# Patient Record
Sex: Female | Born: 1965 | Race: Black or African American | Hispanic: No | Marital: Married | Smoking: Never smoker
Health system: Southern US, Community
[De-identification: ages and names within clinical notes are randomized; demographics above are authoritative.]

## PROBLEM LIST (undated history)

## (undated) DIAGNOSIS — I10 Essential (primary) hypertension: Secondary | ICD-10-CM

## (undated) DIAGNOSIS — E119 Type 2 diabetes mellitus without complications: Secondary | ICD-10-CM

## (undated) DIAGNOSIS — M869 Osteomyelitis, unspecified: Secondary | ICD-10-CM

## (undated) DIAGNOSIS — D649 Anemia, unspecified: Secondary | ICD-10-CM

## (undated) DIAGNOSIS — H409 Unspecified glaucoma: Secondary | ICD-10-CM

## (undated) HISTORY — PX: CATARACT EXTRACTION, BILATERAL: SHX1313

## (undated) HISTORY — DX: Unspecified glaucoma: H40.9

## (undated) HISTORY — DX: Essential (primary) hypertension: I10

## (undated) HISTORY — DX: Type 2 diabetes mellitus without complications: E11.9

---

## 1999-12-12 ENCOUNTER — Emergency Department (HOSPITAL_COMMUNITY): Admission: EM | Admit: 1999-12-12 | Discharge: 1999-12-12 | Payer: Self-pay | Admitting: Emergency Medicine

## 1999-12-12 ENCOUNTER — Encounter: Payer: Self-pay | Admitting: *Deleted

## 1999-12-13 ENCOUNTER — Encounter: Payer: Self-pay | Admitting: Surgery

## 1999-12-13 ENCOUNTER — Emergency Department (HOSPITAL_COMMUNITY): Admission: EM | Admit: 1999-12-13 | Discharge: 1999-12-13 | Payer: Self-pay | Admitting: Emergency Medicine

## 1999-12-24 ENCOUNTER — Encounter: Admission: RE | Admit: 1999-12-24 | Discharge: 2000-03-23 | Payer: Self-pay | Admitting: Emergency Medicine

## 2001-01-14 ENCOUNTER — Encounter: Admission: RE | Admit: 2001-01-14 | Discharge: 2001-04-14 | Payer: Self-pay | Admitting: Emergency Medicine

## 2007-04-28 HISTORY — PX: OTHER SURGICAL HISTORY: SHX169

## 2007-09-01 ENCOUNTER — Encounter: Admission: RE | Admit: 2007-09-01 | Discharge: 2007-09-01 | Payer: Self-pay | Admitting: Family Medicine

## 2007-11-21 ENCOUNTER — Encounter: Admission: RE | Admit: 2007-11-21 | Discharge: 2008-01-17 | Payer: Self-pay | Admitting: Family Medicine

## 2007-11-24 ENCOUNTER — Encounter: Admission: RE | Admit: 2007-11-24 | Discharge: 2007-11-24 | Payer: Self-pay | Admitting: Surgery

## 2008-01-04 ENCOUNTER — Ambulatory Visit (HOSPITAL_BASED_OUTPATIENT_CLINIC_OR_DEPARTMENT_OTHER): Admission: RE | Admit: 2008-01-04 | Discharge: 2008-01-04 | Payer: Self-pay | Admitting: Surgery

## 2008-01-04 ENCOUNTER — Encounter (INDEPENDENT_AMBULATORY_CARE_PROVIDER_SITE_OTHER): Payer: Self-pay | Admitting: Surgery

## 2008-10-04 ENCOUNTER — Encounter: Admission: RE | Admit: 2008-10-04 | Discharge: 2008-10-04 | Payer: Self-pay | Admitting: Family Medicine

## 2008-11-28 ENCOUNTER — Emergency Department (HOSPITAL_COMMUNITY): Admission: EM | Admit: 2008-11-28 | Discharge: 2008-11-28 | Payer: Self-pay | Admitting: Emergency Medicine

## 2010-04-11 ENCOUNTER — Encounter
Admission: RE | Admit: 2010-04-11 | Discharge: 2010-04-11 | Payer: Self-pay | Source: Home / Self Care | Attending: Family Medicine | Admitting: Family Medicine

## 2010-07-28 ENCOUNTER — Emergency Department (HOSPITAL_COMMUNITY)
Admission: EM | Admit: 2010-07-28 | Discharge: 2010-07-29 | Disposition: A | Discharge status: Home / Self Care | Payer: Managed Care, Other (non HMO) | Attending: Emergency Medicine | Admitting: Emergency Medicine

## 2010-07-28 DIAGNOSIS — R112 Nausea with vomiting, unspecified: Secondary | ICD-10-CM | POA: Insufficient documentation

## 2010-07-28 DIAGNOSIS — I10 Essential (primary) hypertension: Secondary | ICD-10-CM | POA: Insufficient documentation

## 2010-07-28 DIAGNOSIS — E119 Type 2 diabetes mellitus without complications: Secondary | ICD-10-CM | POA: Insufficient documentation

## 2010-07-28 LAB — POCT CARDIAC MARKERS
CKMB, poc: 6.8 ng/mL (ref 1.0–8.0)
Myoglobin, poc: 206 ng/mL (ref 12–200)
Troponin i, poc: <0.05 ng/mL (ref 0.00–0.09)

## 2010-07-28 LAB — URINALYSIS, ROUTINE W REFLEX MICROSCOPIC
Bilirubin Urine: NEGATIVE
Glucose, UA: >1000 mg/dL — AB
Protein, ur: NEGATIVE mg/dL
Specific Gravity, Urine: 1.023 (ref 1.005–1.030)
Urobilinogen, UA: 1 mg/dL (ref 0.0–1.0)

## 2010-07-28 LAB — DIFFERENTIAL
Basophils Relative: 1 % (ref 0–1)
Eosinophils Absolute: 0.1 10*3/uL (ref 0.0–0.7)
Lymphocytes Relative: 19 % (ref 12–46)
Neutrophils Relative %: 73 % (ref 43–77)

## 2010-07-28 LAB — BASIC METABOLIC PANEL
CO2: 26 mEq/L (ref 19–32)
Chloride: 106 mEq/L (ref 96–112)
GFR calc Af Amer: >60 mL/min (ref >60)
Potassium: 3.9 mEq/L (ref 3.5–5.1)
Sodium: 138 mEq/L (ref 135–145)

## 2010-07-28 LAB — CBC
Hemoglobin: 10.9 g/dL — ABNORMAL LOW (ref 12.0–15.0)
MCH: 21.2 pg — ABNORMAL LOW (ref 26.0–34.0)
RBC: 5.13 MIL/uL — ABNORMAL HIGH (ref 3.87–5.11)
WBC: 7.1 10*3/uL (ref 4.0–10.5)

## 2010-07-28 LAB — URINE MICROSCOPIC-ADD ON

## 2010-07-29 LAB — GLUCOSE, CAPILLARY: Glucose-Capillary: 274 mg/dL — ABNORMAL HIGH (ref 70–99)

## 2010-08-02 LAB — DIFFERENTIAL
Basophils Relative: 1 % (ref 0–1)
Eosinophils Relative: 1 % (ref 0–5)
Monocytes Absolute: 0.4 10*3/uL (ref 0.1–1.0)
Neutrophils Relative %: 71 % (ref 43–77)

## 2010-08-02 LAB — URINALYSIS, ROUTINE W REFLEX MICROSCOPIC
Glucose, UA: NEGATIVE mg/dL
Protein, ur: NEGATIVE mg/dL
Urobilinogen, UA: 0.2 mg/dL (ref 0.0–1.0)

## 2010-08-02 LAB — BASIC METABOLIC PANEL
BUN: 11 mg/dL (ref 6–23)
CO2: 29 mEq/L (ref 19–32)
Calcium: 8.9 mg/dL (ref 8.4–10.5)
Glucose, Bld: 141 mg/dL — ABNORMAL HIGH (ref 70–99)

## 2010-08-02 LAB — GLUCOSE, CAPILLARY: Glucose-Capillary: 136 mg/dL — ABNORMAL HIGH (ref 70–99)

## 2010-08-02 LAB — CBC
HCT: 38.5 % (ref 36.0–46.0)
Hemoglobin: 12.8 g/dL (ref 12.0–15.0)
MCV: 83.1 fL (ref 78.0–100.0)
RBC: 4.63 MIL/uL (ref 3.87–5.11)
WBC: 5.6 10*3/uL (ref 4.0–10.5)

## 2010-08-02 LAB — POCT PREGNANCY, URINE: Preg Test, Ur: NEGATIVE

## 2010-09-09 NOTE — Op Note (Signed)
NAMEBRANDE, UNCAPHER                  ACCOUNT NO.:  [id number]   MEDICAL RECORD NO.:  [id number]          PATIENT TYPE:  AMB   LOCATION:  DSC                          FACILITY:  [facility]   PHYSICIAN:  Thornton Park. Daphine Deutscher, MD  DATE OF BIRTH:  1965-09-17   DATE OF PROCEDURE:  01/04/2008  DATE OF DISCHARGE:                               OPERATIVE REPORT   PREOPERATIVE INDICATIONS:  This is a 45 year old African American lady  with recurrent yellow oily discharge from the center portion of the left  nipple.  Unsuccessful ductography.   PROCEDURE:  Left breast biopsy.   SURGEON:  Thornton Park. Daphine Deutscher, MD   ANESTHESIA:  General.   DESCRIPTION OF PROCEDURE:  Mulki Roesler was taken to room #3 at [facility] on January 04, 2008, and given general anesthesia.  The left  breast was examined and previously had been marked, and after she was  asleep, I could generate the central discharge that had been previously  noted.  An inferior periareolar incision was made after I cannulated the  central duct with a small breast probe.  Using 2.5 power loupe  magnification, I did a retroareolar dissection encountering the point of  my probe and encountering yellowish fluid coming from a fairly complex  large retroareolar cyst.  I excised this in multiple pieces and it  tended to run out toward the 2 o'clock position in the left breast.  I  felt like I had excised it in toto, and massaging the breast in looking  down into this cavity, no other drainage was noted and no palpable  masses or any way worrisome were seen.  Some of this fluid was gaiety  yellow-gold and oily and some of it was more of a darkish look like more  chronic fibrocystic fluid.  The area was irrigated and then closed with  4-0 Vicryls and with Benzoin and Steri-Strips.  The patient tolerated  the procedure well.  She will be given Tylox to take for pain and will  be followed up in the office in about 3 weeks.  Permanent sections and  pathology or pending.      Thornton Park Daphine Deutscher, MD  Electronically Signed     MBM/MEDQ  D:  01/04/2008  T:  01/05/2008  Job:  [id number]   cc:   Annia Friendly. Loleta Chance, MD  Trude Mcburney. Kearney Hard, M.D.

## 2011-01-28 LAB — GLUCOSE, CAPILLARY: Glucose-Capillary: 99

## 2011-01-28 LAB — BASIC METABOLIC PANEL
BUN: 8
Calcium: 8.8
Creatinine, Ser: 1.12
GFR calc non Af Amer: 53 — ABNORMAL LOW
Potassium: 4.2

## 2016-04-27 HISTORY — PX: OTHER SURGICAL HISTORY: SHX169

## 2017-04-27 HISTORY — PX: EYE SURGERY: SHX253

## 2019-10-25 ENCOUNTER — Other Ambulatory Visit (HOSPITAL_COMMUNITY): Payer: Self-pay | Admitting: Physician Assistant

## 2019-10-25 DIAGNOSIS — Z1231 Encounter for screening mammogram for malignant neoplasm of breast: Secondary | ICD-10-CM

## 2019-11-15 ENCOUNTER — Ambulatory Visit (HOSPITAL_COMMUNITY)
Admission: RE | Admit: 2019-11-15 | Discharge: 2019-11-15 | Disposition: A | Discharge status: Home / Self Care | Payer: Managed Care, Other (non HMO) | Source: Ambulatory Visit | Attending: Physician Assistant | Admitting: Physician Assistant

## 2019-11-15 ENCOUNTER — Other Ambulatory Visit: Payer: Self-pay

## 2019-11-15 DIAGNOSIS — Z1231 Encounter for screening mammogram for malignant neoplasm of breast: Secondary | ICD-10-CM | POA: Diagnosis present

## 2019-12-18 ENCOUNTER — Other Ambulatory Visit (HOSPITAL_COMMUNITY)
Admission: RE | Admit: 2019-12-18 | Discharge: 2019-12-18 | Disposition: A | Discharge status: Home / Self Care | Payer: Managed Care, Other (non HMO) | Source: Ambulatory Visit | Attending: Adult Health | Admitting: Adult Health

## 2019-12-18 ENCOUNTER — Ambulatory Visit (INDEPENDENT_AMBULATORY_CARE_PROVIDER_SITE_OTHER): Payer: Managed Care, Other (non HMO) | Admitting: Adult Health

## 2019-12-18 ENCOUNTER — Encounter: Payer: Self-pay | Admitting: Adult Health

## 2019-12-18 VITALS — BP 137/70 | HR 57 | Ht 68.0 in | Wt 226.8 lb

## 2019-12-18 DIAGNOSIS — Z01419 Encounter for gynecological examination (general) (routine) without abnormal findings: Secondary | ICD-10-CM

## 2019-12-18 DIAGNOSIS — Z1211 Encounter for screening for malignant neoplasm of colon: Secondary | ICD-10-CM

## 2019-12-18 LAB — HEMOCCULT GUIAC POC 1CARD (OFFICE): Fecal Occult Blood, POC: NEGATIVE

## 2019-12-18 NOTE — Progress Notes (Signed)
Patient ID: Jenny Newton, female   DOB: 12/19/1965, 54 y.o.   MRN: [id number] History of Present Illness: Jenny Newton is a 54 year old black female,married, PM in for a well woman gyn exam and pap. She is a new pt PCP is Dwyane Luo PA at [facility].    Current Medications, Allergies, Past Medical History, Past Surgical History, Family History and Social History were reviewed in [employer] record.     Review of Systems: Patient denies any headaches, hearing loss, fatigue, blurred vision, shortness of breath, chest pain, abdominal pain, problems with bowel movements, urination, or intercourse. No joint pain or mood swings. No vaginal bleeding since stopping period.    Physical Exam:BP 137/70 (BP Location: Right Arm, Patient Position: Sitting, Cuff Size: Normal)   Pulse (!) 57   Ht 5\' 8"  (1.727 m)   Wt 226 lb 12.8 oz (102.9 kg)   BMI 34.48 kg/m  General:  Well developed, well nourished, no acute distress Skin:  Warm and dry,has skin tags on neck Neck:  Midline trachea, normal thyroid, good ROM, no lymphadenopathy Lungs; Clear to auscultation bilaterally Breast:  No dominant palpable mass, retraction, or nipple discharge Cardiovascular: Regular rate and rhythm Abdomen:  Soft, non tender, no hepatosplenomegaly Pelvic:  External genitalia is normal in appearance, no lesions.  The vagina is normal in appearance. Urethra has no lesions or masses. The cervix is smooth, pap with high risk HPV genotyping performed.  Uterus is felt to be normal size, shape, and contour.  No adnexal masses or tenderness noted.Bladder is non tender, no masses felt. Rectal: Good sphincter tone, no polyps, or hemorrhoids felt.  Hemoccult negative. Extremities/musculoskeletal:  No swelling or varicosities noted, no clubbing or cyanosis Psych:  No mood changes, alert and cooperative,seems happy AA is 0 Fall risk is low PHQ 9 score is 0. Examination chaperoned by LPN    [facility] - 12/18/19  0930      Pregnancy Intention Screening   Does the patient want to become pregnant in the next year? N/A    Does the patient's partner want to become pregnant in the next year? N/A    Would the patient like to discuss contraceptive options today? N/A      Contraception Wrap Up   Contraception Counseling Provided No          Impression and plan: 1. Encounter for gynecological examination with Papanicolaou smear of cervix Pap sent  Physical in 1 year  Pap in 3 if normal Mammogram yearly, was negative in July Labs with PCP Pt got J&J vaccine October 01 2019 she says  Colonoscopy per GI   2. Encounter for screening fecal occult blood testing

## 2019-12-19 LAB — CYTOLOGY - PAP
Adequacy: ABSENT
Comment: NEGATIVE
Diagnosis: NEGATIVE
High risk HPV: NEGATIVE

## 2019-12-20 ENCOUNTER — Telehealth: Payer: Self-pay | Admitting: *Deleted

## 2019-12-20 NOTE — Telephone Encounter (Signed)
Telephoned patient at home number and left message to return call.  

## 2020-06-14 ENCOUNTER — Other Ambulatory Visit (HOSPITAL_COMMUNITY): Payer: Self-pay | Admitting: Orthopedic Surgery

## 2020-06-14 ENCOUNTER — Encounter (HOSPITAL_BASED_OUTPATIENT_CLINIC_OR_DEPARTMENT_OTHER): Payer: Self-pay | Admitting: Orthopedic Surgery

## 2020-06-14 ENCOUNTER — Other Ambulatory Visit: Payer: Self-pay

## 2020-06-17 ENCOUNTER — Encounter (HOSPITAL_BASED_OUTPATIENT_CLINIC_OR_DEPARTMENT_OTHER)
Admission: RE | Admit: 2020-06-17 | Discharge: 2020-06-17 | Disposition: A | Discharge status: Home / Self Care | Payer: Managed Care, Other (non HMO) | Source: Ambulatory Visit | Attending: Orthopedic Surgery | Admitting: Orthopedic Surgery

## 2020-06-17 ENCOUNTER — Other Ambulatory Visit (HOSPITAL_COMMUNITY)
Admission: RE | Admit: 2020-06-17 | Discharge: 2020-06-17 | Disposition: A | Discharge status: Home / Self Care | Payer: Managed Care, Other (non HMO) | Source: Ambulatory Visit | Attending: Orthopedic Surgery | Admitting: Orthopedic Surgery

## 2020-06-17 DIAGNOSIS — Z01818 Encounter for other preprocedural examination: Secondary | ICD-10-CM | POA: Insufficient documentation

## 2020-06-17 DIAGNOSIS — Z01812 Encounter for preprocedural laboratory examination: Secondary | ICD-10-CM | POA: Insufficient documentation

## 2020-06-17 DIAGNOSIS — E119 Type 2 diabetes mellitus without complications: Secondary | ICD-10-CM | POA: Diagnosis not present

## 2020-06-17 DIAGNOSIS — I10 Essential (primary) hypertension: Secondary | ICD-10-CM | POA: Diagnosis not present

## 2020-06-17 DIAGNOSIS — Z20822 Contact with and (suspected) exposure to covid-19: Secondary | ICD-10-CM | POA: Insufficient documentation

## 2020-06-17 LAB — BASIC METABOLIC PANEL
Anion gap: 8 (ref 5–15)
BUN: 18 mg/dL (ref 6–20)
CO2: 25 mmol/L (ref 22–32)
Calcium: 9.3 mg/dL (ref 8.9–10.3)
Chloride: 102 mmol/L (ref 98–111)
Creatinine, Ser: 1.57 mg/dL — ABNORMAL HIGH (ref 0.44–1.00)
GFR, Estimated: 38 mL/min — ABNORMAL LOW (ref >60)
Glucose, Bld: 260 mg/dL — ABNORMAL HIGH (ref 70–99)
Potassium: 4.7 mmol/L (ref 3.5–5.1)
Sodium: 135 mmol/L (ref 135–145)

## 2020-06-17 NOTE — Progress Notes (Signed)

## 2020-06-18 LAB — SARS CORONAVIRUS 2 (TAT 6-24 HRS): SARS Coronavirus 2: NEGATIVE

## 2020-06-20 ENCOUNTER — Encounter (HOSPITAL_BASED_OUTPATIENT_CLINIC_OR_DEPARTMENT_OTHER): Payer: Self-pay | Admitting: Orthopedic Surgery

## 2020-06-20 ENCOUNTER — Ambulatory Visit (HOSPITAL_BASED_OUTPATIENT_CLINIC_OR_DEPARTMENT_OTHER)
Admission: RE | Admit: 2020-06-20 | Discharge: 2020-06-20 | Disposition: A | Discharge status: Home / Self Care | Payer: Managed Care, Other (non HMO) | Attending: Orthopedic Surgery | Admitting: Orthopedic Surgery

## 2020-06-20 ENCOUNTER — Ambulatory Visit (HOSPITAL_BASED_OUTPATIENT_CLINIC_OR_DEPARTMENT_OTHER): Payer: Managed Care, Other (non HMO) | Admitting: Anesthesiology

## 2020-06-20 ENCOUNTER — Encounter (HOSPITAL_BASED_OUTPATIENT_CLINIC_OR_DEPARTMENT_OTHER)
Admission: RE | Disposition: A | Discharge status: Home / Self Care | Payer: Self-pay | Source: Home / Self Care | Attending: Orthopedic Surgery

## 2020-06-20 ENCOUNTER — Other Ambulatory Visit: Payer: Self-pay

## 2020-06-20 DIAGNOSIS — L97528 Non-pressure chronic ulcer of other part of left foot with other specified severity: Secondary | ICD-10-CM | POA: Insufficient documentation

## 2020-06-20 DIAGNOSIS — I96 Gangrene, not elsewhere classified: Secondary | ICD-10-CM | POA: Diagnosis not present

## 2020-06-20 DIAGNOSIS — M869 Osteomyelitis, unspecified: Secondary | ICD-10-CM | POA: Insufficient documentation

## 2020-06-20 DIAGNOSIS — Z79899 Other long term (current) drug therapy: Secondary | ICD-10-CM | POA: Diagnosis not present

## 2020-06-20 DIAGNOSIS — E11621 Type 2 diabetes mellitus with foot ulcer: Secondary | ICD-10-CM | POA: Insufficient documentation

## 2020-06-20 DIAGNOSIS — E1169 Type 2 diabetes mellitus with other specified complication: Secondary | ICD-10-CM | POA: Diagnosis present

## 2020-06-20 DIAGNOSIS — L97524 Non-pressure chronic ulcer of other part of left foot with necrosis of bone: Secondary | ICD-10-CM

## 2020-06-20 DIAGNOSIS — Z7984 Long term (current) use of oral hypoglycemic drugs: Secondary | ICD-10-CM | POA: Diagnosis not present

## 2020-06-20 DIAGNOSIS — M2042 Other hammer toe(s) (acquired), left foot: Secondary | ICD-10-CM | POA: Insufficient documentation

## 2020-06-20 DIAGNOSIS — E1152 Type 2 diabetes mellitus with diabetic peripheral angiopathy with gangrene: Secondary | ICD-10-CM | POA: Insufficient documentation

## 2020-06-20 HISTORY — PX: AMPUTATION TOE: SHX6595

## 2020-06-20 HISTORY — DX: Osteomyelitis, unspecified: M86.9

## 2020-06-20 HISTORY — DX: Anemia, unspecified: D64.9

## 2020-06-20 LAB — GLUCOSE, CAPILLARY
Glucose-Capillary: 142 mg/dL — ABNORMAL HIGH (ref 70–99)
Glucose-Capillary: 127 mg/dL — ABNORMAL HIGH (ref 70–99)

## 2020-06-20 SURGERY — AMPUTATION, TOE
Anesthesia: Monitor Anesthesia Care | Site: Toe | Laterality: Left

## 2020-06-20 MED ORDER — BUPIVACAINE HCL (PF) 0.5 % IJ SOLN
INTRAMUSCULAR | Status: DC | PRN
Start: 2020-06-20 — End: 2020-06-20
  Administered 2020-06-20: 5 mL

## 2020-06-20 MED ORDER — CEFAZOLIN SODIUM-DEXTROSE 2-4 GM/100ML-% IV SOLN
2.0000 g | INTRAVENOUS | Status: AC
  Administered 2020-06-20: 2 g via INTRAVENOUS

## 2020-06-20 MED ORDER — 0.9 % SODIUM CHLORIDE (POUR BTL) OPTIME
TOPICAL | Status: DC | PRN
  Administered 2020-06-20: 200 mL

## 2020-06-20 MED ORDER — VANCOMYCIN HCL 500 MG IV SOLR
INTRAVENOUS | Status: DC | PRN
  Administered 2020-06-20: 500 mg via TOPICAL

## 2020-06-20 MED ORDER — ONDANSETRON HCL 4 MG/2ML IJ SOLN
INTRAMUSCULAR | Status: DC | PRN
  Administered 2020-06-20: 4 mg via INTRAVENOUS

## 2020-06-20 MED ORDER — PROPOFOL 10 MG/ML IV BOLUS
INTRAVENOUS | Status: DC | PRN
  Administered 2020-06-20: 50 mg via INTRAVENOUS

## 2020-06-20 MED ORDER — PROPOFOL 500 MG/50ML IV EMUL
INTRAVENOUS | Status: DC | PRN
  Administered 2020-06-20: 100 ug/kg/min via INTRAVENOUS

## 2020-06-20 MED ORDER — CEFAZOLIN SODIUM-DEXTROSE 2-4 GM/100ML-% IV SOLN
INTRAVENOUS | Status: AC
  Filled 2020-06-20: qty 100

## 2020-06-20 MED ORDER — LACTATED RINGERS IV SOLN: INTRAVENOUS | Status: DC

## 2020-06-20 MED ORDER — LIDOCAINE 2% (20 MG/ML) 5 ML SYRINGE
INTRAMUSCULAR | Status: DC | PRN
  Administered 2020-06-20: 60 mg via INTRAVENOUS

## 2020-06-20 MED ORDER — VANCOMYCIN HCL 500 MG IV SOLR
INTRAVENOUS | Status: AC
  Filled 2020-06-20: qty 500

## 2020-06-20 MED ORDER — SODIUM CHLORIDE 0.9 % IV SOLN: INTRAVENOUS | Status: DC

## 2020-06-20 MED ORDER — BUPIVACAINE HCL (PF) 0.5 % IJ SOLN
INTRAMUSCULAR | Status: AC
  Filled 2020-06-20: qty 30

## 2020-06-20 MED ORDER — LIDOCAINE-EPINEPHRINE 1 %-1:100000 IJ SOLN
INTRAMUSCULAR | Status: DC | PRN
  Administered 2020-06-20: 5 mL

## 2020-06-20 MED ORDER — LIDOCAINE-EPINEPHRINE 1 %-1:100000 IJ SOLN
INTRAMUSCULAR | Status: AC
  Filled 2020-06-20: qty 1

## 2020-06-20 SURGICAL SUPPLY — 58 items
BLADE AVERAGE 25X9 (BLADE) IMPLANT
BLADE OSC/SAG .038X5.5 CUT EDG (BLADE) IMPLANT
BLADE SURG 10 STRL SS (BLADE) IMPLANT
BLADE SURG 15 STRL LF DISP TIS (BLADE) ×1 IMPLANT
BLADE SURG 15 STRL SS (BLADE) ×2
BNDG COHESIVE 4X5 TAN STRL (GAUZE/BANDAGES/DRESSINGS) ×2 IMPLANT
BNDG CONFORM 3 STRL LF (GAUZE/BANDAGES/DRESSINGS) IMPLANT
BNDG ESMARK 4X9 LF (GAUZE/BANDAGES/DRESSINGS) ×2 IMPLANT
CHLORAPREP W/TINT 26 (MISCELLANEOUS) ×2 IMPLANT
COVER BACK TABLE 60X90IN (DRAPES) ×2 IMPLANT
COVER WAND RF STERILE (DRAPES) IMPLANT
CUFF TOURN SGL QUICK 24 (TOURNIQUET CUFF)
CUFF TRNQT CYL 24X4X16.5-23 (TOURNIQUET CUFF) IMPLANT
DECANTER SPIKE VIAL GLASS SM (MISCELLANEOUS) IMPLANT
DRAPE EXTREMITY T 121X128X90 (DISPOSABLE) ×2 IMPLANT
DRAPE SURG 17X23 STRL (DRAPES) ×2 IMPLANT
DRAPE U-SHAPE 47X51 STRL (DRAPES) ×2 IMPLANT
DRSG EMULSION OIL 3X3 NADH (GAUZE/BANDAGES/DRESSINGS) IMPLANT
DRSG MEPITEL 4X7.2 (GAUZE/BANDAGES/DRESSINGS) ×2 IMPLANT
DRSG PAD ABDOMINAL 8X10 ST (GAUZE/BANDAGES/DRESSINGS) IMPLANT
ELECT REM PT RETURN 9FT ADLT (ELECTROSURGICAL) ×2
ELECTRODE REM PT RTRN 9FT ADLT (ELECTROSURGICAL) ×1 IMPLANT
GAUZE SPONGE 4X4 12PLY STRL (GAUZE/BANDAGES/DRESSINGS) ×2 IMPLANT
GLOVE SRG 8 PF TXTR STRL LF DI (GLOVE) ×2 IMPLANT
GLOVE SURG ENC MOIS LTX SZ8 (GLOVE) ×2 IMPLANT
GLOVE SURG LTX SZ8 (GLOVE) ×2 IMPLANT
GLOVE SURG UNDER POLY LF SZ8 (GLOVE) ×4
GOWN STRL REUS W/ TWL LRG LVL3 (GOWN DISPOSABLE) ×1 IMPLANT
GOWN STRL REUS W/ TWL XL LVL3 (GOWN DISPOSABLE) ×2 IMPLANT
GOWN STRL REUS W/TWL LRG LVL3 (GOWN DISPOSABLE) ×2
GOWN STRL REUS W/TWL XL LVL3 (GOWN DISPOSABLE) ×4
NDL SAFETY ECLIPSE 18X1.5 (NEEDLE) IMPLANT
NEEDLE HYPO 18GX1.5 SHARP (NEEDLE)
NEEDLE HYPO 25X1 1.5 SAFETY (NEEDLE) IMPLANT
NS IRRIG 1000ML POUR BTL (IV SOLUTION) ×2 IMPLANT
PACK BASIN DAY SURGERY FS (CUSTOM PROCEDURE TRAY) ×2 IMPLANT
PAD CAST 4YDX4 CTTN HI CHSV (CAST SUPPLIES) ×1 IMPLANT
PADDING CAST COTTON 4X4 STRL (CAST SUPPLIES) ×2
PENCIL SMOKE EVACUATOR (MISCELLANEOUS) ×2 IMPLANT
SANITIZER HAND PURELL 535ML FO (MISCELLANEOUS) ×2 IMPLANT
SHEET MEDIUM DRAPE 40X70 STRL (DRAPES) ×2 IMPLANT
SLEEVE SCD COMPRESS KNEE MED (MISCELLANEOUS) ×2 IMPLANT
SPONGE LAP 18X18 RF (DISPOSABLE) ×2 IMPLANT
STOCKINETTE 6  STRL (DRAPES) ×2
STOCKINETTE 6 STRL (DRAPES) ×1 IMPLANT
SUCTION FRAZIER HANDLE 10FR (MISCELLANEOUS)
SUCTION TUBE FRAZIER 10FR DISP (MISCELLANEOUS) IMPLANT
SUT ETHILON 2 0 FS 18 (SUTURE) IMPLANT
SUT ETHILON 2 0 FSLX (SUTURE) IMPLANT
SUT ETHILON 3 0 PS 1 (SUTURE) IMPLANT
SUT MNCRL AB 3-0 PS2 18 (SUTURE) IMPLANT
SWAB COLLECTION DEVICE MRSA (MISCELLANEOUS) IMPLANT
SWAB CULTURE ESWAB REG 1ML (MISCELLANEOUS) IMPLANT
SYR BULB EAR ULCER 3OZ GRN STR (SYRINGE) ×2 IMPLANT
SYR CONTROL 10ML LL (SYRINGE) IMPLANT
TOWEL GREEN STERILE FF (TOWEL DISPOSABLE) ×2 IMPLANT
TUBE CONNECTING 20X1/4 (TUBING) IMPLANT
UNDERPAD 30X36 HEAVY ABSORB (UNDERPADS AND DIAPERS) ×2 IMPLANT

## 2020-06-20 NOTE — Op Note (Signed)
06/20/2020  2:11 PM  PATIENT:  Jenny Newton  55 y.o. female  PRE-OPERATIVE DIAGNOSIS: Left third toe osteomyelitis and gangrene  POST-OPERATIVE DIAGNOSIS: Same  Procedure(s):  Left 3rd toe amputation through the MTP joint  SURGEON:  Wylene Simmer, MD  ASSISTANT: None  ANESTHESIA:   MAC, local  EBL:  minimal   TOURNIQUET:   Total Tourniquet Time Documented: Calf (Left) - 6 minutes Total: Calf (Left) - 6 minutes  COMPLICATIONS:  None apparent  DISPOSITION:  Extubated, awake and stable to recovery.  INDICATION FOR PROCEDURE: The patient is a 55 year old female with a past medical history significant for diabetes. She has a long history of left hammertoe deformities and developed an ulcer which progressed to cellulitis, osteomyelitis and gangrene. She presents now for left third toe amputation.  The risks and benefits of the alternative treatment options have been discussed in detail.  The patient wishes to proceed with surgery and specifically understands risks of bleeding, infection, nerve damage, blood clots, need for additional surgery, amputation and death.  PROCEDURE IN DETAIL:  After pre operative consent was obtained, and the correct operative site was identified, the patient was brought to the operating room and placed supine on the OR table.  Anesthesia was administered.  Pre-operative antibiotics were administered.  A surgical timeout was taken. The left lower extremity was prepped and draped in standard sterile fashion. The foot was exsanguinated and a 4 inch Esmarch tourniquet wrapped around the ankle. An intermetatarsal block was performed with half percent Marcaine with epinephrine. Once adequate anesthesia was achieved a racquet style incision was marked on the skin. Dissection was carried sharply down through the skin and subcutaneous tissues to the level of the bone of the proximal phalanx. Subperiosteal dissection was then carried to the level of the MTP joint and the toe  was disarticulated and removed. It was passed off the field. The wound was irrigated copiously. All remaining soft tissues appeared generally healthy. The neurovascular bundles were cauterized. The tourniquet was released and hemostasis was achieved. The wound was sprinkled with vancomycin powder. The incision was closed with horizontal mattress sutures of 3-0 nylon. Sterile dressings were applied followed by compression wrap. The patient was awakened from anesthesia and transported to the recovery room in stable condition.  FOLLOW UP PLAN: Weightbearing as tolerated in a flat postop shoe. Follow-up in the office in 2 weeks for a wound check and possible suture removal.

## 2020-06-20 NOTE — Transfer of Care (Signed)
Immediate Anesthesia Transfer of Care Note  Patient: Jenny Newton  Procedure(s) Performed: Left 3rd toe amputation (Left Toe)  Patient Location: PACU  Anesthesia Type:MAC  Level of Consciousness: sedated  Airway & Oxygen Therapy: Patient Spontanous Breathing and Patient connected to face mask oxygen  Post-op Assessment: Report given to RN and Post -op Vital signs reviewed and stable  Post vital signs: Reviewed and stable  Last Vitals:  Vitals Value Taken Time  BP 123/70 06/20/20 1405  Temp    Pulse 56 06/20/20 1406  Resp 23 06/20/20 1406  SpO2 100 % 06/20/20 1406  Vitals shown include unvalidated device data.  Last Pain:  Vitals:   06/20/20 1213  TempSrc: Oral  PainSc: 0-No pain      Patients Stated Pain Goal: 2 (51/70/01 7494)  Complications: No complications documented.

## 2020-06-20 NOTE — Discharge Instructions (Addendum)
John Hewitt, MD [facility]  Please read the following information regarding your care after surgery.  Medications  You only need a prescription for the narcotic pain medicine (ex. oxycodone, Percocet, Norco).  All of the other medicines listed below are available over the counter. ? Aleve 2 pills twice a day for the first 3 days after surgery. X acetominophen (Tylenol) 650 mg every 4-6 hours as you need for minor to moderate pain ? oxycodone as prescribed for severe pain  Narcotic pain medicine (ex. oxycodone, Percocet, Vicodin) will cause constipation.  To prevent this problem, take the following medicines while you are taking any pain medicine. ? docusate sodium (Colace) 100 mg twice a day ? senna (Senokot) 2 tablets twice a day  ? To help prevent blood clots, take a baby aspirin (81 mg) twice a day for two weeks after surgery.  You should also get up every hour while you are awake to move around.    Weight Bearing ? Bear weight when you are able on your operated leg or foot. X Bear weight only on your operated foot in the post-op shoe. ? Do not bear any weight on the operated leg or foot.  Cast / Splint / Dressing X Keep your splint, cast or dressing clean and dry.  Don't put anything (coat hanger, pencil, etc) down inside of it.  If it gets damp, use a hair dryer on the cool setting to dry it.  If it gets soaked, call the office to schedule an appointment for a cast change. ? Remove your dressing 3 days after surgery and cover the incisions with dry dressings.    After your dressing, cast or splint is removed; you may shower, but do not soak or scrub the wound.  Allow the water to run over it, and then gently pat it dry.  Swelling It is normal for you to have swelling where you had surgery.  To reduce swelling and pain, keep your toes above your nose for at least 3 days after surgery.  It may be necessary to keep your foot or leg elevated for several weeks.  If it hurts, it should be  elevated.  Follow Up Call my office at [phone] when you are discharged from the hospital or surgery center to schedule an appointment to be seen two weeks after surgery.  Call my office at [phone] if you develop a fever >101.5 F, nausea, vomiting, bleeding from the surgical site or severe pain.     Post Anesthesia Home Care Instructions  Activity: Get plenty of rest for the remainder of the day. A responsible individual must stay with you for 24 hours following the procedure.  For the next 24 hours, DO NOT: -Drive a car -Operate machinery -Drink alcoholic beverages -Take any medication unless instructed by your physician -Make any legal decisions or sign important papers.  Meals: Start with liquid foods such as gelatin or soup. Progress to regular foods as tolerated. Avoid greasy, spicy, heavy foods. If nausea and/or vomiting occur, drink only clear liquids until the nausea and/or vomiting subsides. Call your physician if vomiting continues.  Special Instructions/Symptoms: Your throat may feel dry or sore from the anesthesia or the breathing tube placed in your throat during surgery. If this causes discomfort, gargle with warm salt water. The discomfort should disappear within 24 hours.  If you had a scopolamine patch placed behind your ear for the management of post- operative nausea and/or vomiting:  1. The medication in the patch is   effective for 72 hours, after which it should be removed.  Wrap patch in a tissue and discard in the trash. Wash hands thoroughly with soap and water. 2. You may remove the patch earlier than 72 hours if you experience unpleasant side effects which may include dry mouth, dizziness or visual disturbances. 3. Avoid touching the patch. Wash your hands with soap and water after contact with the patch.     

## 2020-06-20 NOTE — Anesthesia Preprocedure Evaluation (Signed)
Anesthesia Evaluation  Patient identified by MRN, date of birth, ID band Patient awake    Reviewed: Allergy & Precautions, H&P , NPO status , Patient's Chart, lab work & pertinent test results  Airway Mallampati: II   Neck ROM: full    Dental   Pulmonary neg pulmonary ROS,    breath sounds clear to auscultation       Cardiovascular hypertension,  Rhythm:regular Rate:Normal     Neuro/Psych    GI/Hepatic   Endo/Other  diabetes, Type 2obese  Renal/GU Renal InsufficiencyRenal disease     Musculoskeletal   Abdominal   Peds  Hematology   Anesthesia Other Findings   Reproductive/Obstetrics                             Anesthesia Physical Anesthesia Plan  ASA: II  Anesthesia Plan: MAC   Post-op Pain Management:    Induction: Intravenous  PONV Risk Score and Plan: 2 and Ondansetron, Propofol infusion, Treatment may vary due to age or medical condition and Midazolam  Airway Management Planned: Simple Face Mask  Additional Equipment:   Intra-op Plan:   Post-operative Plan:   Informed Consent: I have reviewed the patients History and Physical, chart, labs and discussed the procedure including the risks, benefits and alternatives for the proposed anesthesia with the patient or authorized representative who has indicated his/her understanding and acceptance.     Dental advisory given  Plan Discussed with: CRNA, Anesthesiologist and Surgeon  Anesthesia Plan Comments:         Anesthesia Quick Evaluation

## 2020-06-20 NOTE — H&P (Signed)
Jenny Newton is an 55 y.o. female.  Chief Complaint: Left third toe infection HPI: The patient is a 55 year old female with a past medical history significant for diabetes.  She has a long history of hammertoe deformities.  She has developed an ulcer at the tip of her left foot third toe which has become wet gangrene involving the entire toe.  She presents now for left third toe amputation.  She is taking oral Bactrim.  Past Medical History:  Diagnosis Date  . Anemia   . Diabetes mellitus without complication (HCC)   . Glaucoma   . Hypertension   . Osteomyelitis (HCC)    left 3rd toe    Past Surgical History:  Procedure Laterality Date  . CATARACT EXTRACTION, BILATERAL    . eue surgery  2018  . EYE SURGERY  2019  . left breast surgery  2009    Family History  Problem Relation Age of Onset  . Hypertension Father   . Diabetes Father    Social History:  reports that she has never smoked. She has never used smokeless tobacco. She reports previous alcohol use. She reports previous drug use.  Allergies: No Known Allergies  Medications Prior to Admission  Medication Sig Dispense Refill  . amLODipine (NORVASC) 10 MG tablet Take 10 mg by mouth daily.    . Ascorbic Acid (VITAMIN C) 1000 MG tablet Take 1,000 mg by mouth daily.    . Brinzolamide-Brimonidine (SIMBRINZA) 1-0.2 % SUSP Apply to eye.    . ferrous sulfate 324 MG TBEC Take 324 mg by mouth.    . glyBURIDE (DIABETA) 5 MG tablet Take 5 mg by mouth 2 (two) times daily.    Marland Kitchen losartan-hydrochlorothiazide (HYZAAR) 50-12.5 MG tablet Take 1 tablet by mouth daily.    . metFORMIN (GLUCOPHAGE) 1000 MG tablet SMARTSIG:1 Tablet(s) By Mouth Morning-Evening    . Netarsudil-Latanoprost (ROCKLATAN) 0.02-0.005 % SOLN Apply to eye.    . sulfamethoxazole-trimethoprim (BACTRIM DS) 800-160 MG tablet Take 1 tablet by mouth 2 (two) times daily.      Results for orders placed or performed during the hospital encounter of 06/20/20 (from the past 48  hour(s))  Glucose, capillary     Status: Abnormal   Collection Time: 06/20/20 12:17 PM  Result Value Ref Range   Glucose-Capillary 142 (H) 70 - 99 mg/dL    Comment: Glucose reference range applies only to samples taken after fasting for at least 8 hours.   No results found.  Review of Systems no recent fever, chills, nausea, vomiting or changes in her appetite  Blood pressure (!) 166/67, pulse 60, temperature 98.2 F (36.8 C), temperature source Oral, resp. rate 18, height 5\' 8"  (1.727 m), weight 102.9 kg, SpO2 100 %. Physical Exam  Well-nourished well-developed woman in no apparent distress.  Alert and oriented x4.  Normal mood and affect.  Gait is normal.  Left foot has rigid second, third and fourth hammertoe deformities.  The third toe is gangrenous.  Diminished sensibility to light touch at the left forefoot dorsally and plantarly.  Active plantar flexion and dorsiflexion strength of the ankle is 5 out of 5.   Assessment/Plan Left third toe gangrene -to the operating room today for left third toe amputation through the MP joint.  The risks and benefits of the alternative treatment options have been discussed in detail.  The patient wishes to proceed with surgery and specifically understands risks of bleeding, infection, nerve damage, blood clots, need for additional surgery, amputation and death.  Toni Arthurs, MD 06/20/2020, 1:08 PM

## 2020-06-21 ENCOUNTER — Encounter (HOSPITAL_BASED_OUTPATIENT_CLINIC_OR_DEPARTMENT_OTHER): Payer: Self-pay | Admitting: Orthopedic Surgery

## 2020-06-21 NOTE — Anesthesia Postprocedure Evaluation (Signed)
Anesthesia Post Note  Patient: Jenny Newton  Procedure(s) Performed: Left 3rd toe amputation (Left Toe)     Patient location during evaluation: PACU Anesthesia Type: MAC Level of consciousness: awake and alert Pain management: pain level controlled Vital Signs Assessment: post-procedure vital signs reviewed and stable Respiratory status: spontaneous breathing, nonlabored ventilation, respiratory function stable and patient connected to nasal cannula oxygen Cardiovascular status: stable and blood pressure returned to baseline Postop Assessment: no apparent nausea or vomiting Anesthetic complications: no   No complications documented.  Last Vitals:  Vitals:   06/20/20 1422 06/20/20 1445  BP: (!) 145/69 (!) 158/64  Pulse: (!) 54 (!) 54  Resp: 15 16  Temp:  36.6 C  SpO2: 100% 100%    Last Pain:  Vitals:   06/20/20 1445  TempSrc:   PainSc: 0-No pain                 Merrillyn Ackerley S

## 2020-12-09 ENCOUNTER — Other Ambulatory Visit (HOSPITAL_COMMUNITY): Payer: Self-pay | Admitting: Orthopedic Surgery

## 2020-12-26 ENCOUNTER — Other Ambulatory Visit: Payer: Self-pay

## 2020-12-26 ENCOUNTER — Encounter (HOSPITAL_BASED_OUTPATIENT_CLINIC_OR_DEPARTMENT_OTHER): Payer: Self-pay | Admitting: Orthopedic Surgery

## 2020-12-31 ENCOUNTER — Encounter (HOSPITAL_BASED_OUTPATIENT_CLINIC_OR_DEPARTMENT_OTHER)
Admission: RE | Admit: 2020-12-31 | Discharge: 2020-12-31 | Disposition: A | Discharge status: Home / Self Care | Payer: Managed Care, Other (non HMO) | Source: Ambulatory Visit | Attending: Orthopedic Surgery | Admitting: Orthopedic Surgery

## 2020-12-31 DIAGNOSIS — Z01812 Encounter for preprocedural laboratory examination: Secondary | ICD-10-CM | POA: Insufficient documentation

## 2020-12-31 DIAGNOSIS — M2041 Other hammer toe(s) (acquired), right foot: Secondary | ICD-10-CM | POA: Diagnosis not present

## 2020-12-31 DIAGNOSIS — Z79899 Other long term (current) drug therapy: Secondary | ICD-10-CM | POA: Diagnosis not present

## 2020-12-31 DIAGNOSIS — E11621 Type 2 diabetes mellitus with foot ulcer: Secondary | ICD-10-CM | POA: Diagnosis not present

## 2020-12-31 DIAGNOSIS — Z89422 Acquired absence of other left toe(s): Secondary | ICD-10-CM | POA: Diagnosis not present

## 2020-12-31 DIAGNOSIS — Z833 Family history of diabetes mellitus: Secondary | ICD-10-CM | POA: Diagnosis not present

## 2020-12-31 DIAGNOSIS — Z7984 Long term (current) use of oral hypoglycemic drugs: Secondary | ICD-10-CM | POA: Diagnosis not present

## 2020-12-31 DIAGNOSIS — L97519 Non-pressure chronic ulcer of other part of right foot with unspecified severity: Secondary | ICD-10-CM | POA: Diagnosis present

## 2020-12-31 LAB — BASIC METABOLIC PANEL
Anion gap: 4 — ABNORMAL LOW (ref 5–15)
BUN: 17 mg/dL (ref 6–20)
CO2: 27 mmol/L (ref 22–32)
Calcium: 9.2 mg/dL (ref 8.9–10.3)
Chloride: 110 mmol/L (ref 98–111)
Creatinine, Ser: 1.14 mg/dL — ABNORMAL HIGH (ref 0.44–1.00)
GFR, Estimated: 56 mL/min — ABNORMAL LOW (ref >60)
Glucose, Bld: 165 mg/dL — ABNORMAL HIGH (ref 70–99)
Potassium: 3.8 mmol/L (ref 3.5–5.1)
Sodium: 141 mmol/L (ref 135–145)

## 2020-12-31 NOTE — Progress Notes (Signed)

## 2021-01-01 NOTE — Anesthesia Preprocedure Evaluation (Addendum)
Anesthesia Evaluation  Patient identified by MRN, date of birth, ID band Patient awake    Reviewed: Allergy & Precautions, NPO status , Patient's Chart, lab work & pertinent test results  Airway Mallampati: II  TM Distance: >3 FB Neck ROM: Full    Dental no notable dental hx. (+) Teeth Intact, Dental Advisory Given   Pulmonary neg pulmonary ROS,    Pulmonary exam normal breath sounds clear to auscultation       Cardiovascular hypertension, Normal cardiovascular exam Rhythm:Regular Rate:Normal     Neuro/Psych negative neurological ROS  negative psych ROS   GI/Hepatic negative GI ROS, Neg liver ROS,   Endo/Other  diabetes, Type 2  Renal/GU Renal InsufficiencyRenal diseaseLab Results      Component                Value               Date                      CREATININE               1.14 (H)            12/31/2020                BUN                      17                  12/31/2020                NA                       141                 12/31/2020                K                        3.8                 12/31/2020                CL                       110                 12/31/2020                CO2                      27                  12/31/2020                Musculoskeletal   Abdominal   Peds  Hematology negative hematology ROS (+)   Anesthesia Other Findings   Reproductive/Obstetrics                            Anesthesia Physical Anesthesia Plan  ASA: 3  Anesthesia Plan: MAC   Post-op Pain Management:    Induction:   PONV Risk Score and Plan: Treatment may vary due to age or medical condition, Dexamethasone and Ondansetron  Airway Management Planned: Natural Airway and Nasal  Cannula  Additional Equipment: None  Intra-op Plan:   Post-operative Plan:   Informed Consent: I have reviewed the patients History and Physical, chart, labs and discussed the procedure  including the risks, benefits and alternatives for the proposed anesthesia with the patient or authorized representative who has indicated his/her understanding and acceptance.     Dental advisory given  Plan Discussed with:   Anesthesia Plan Comments: (Mac )       Anesthesia Quick Evaluation

## 2021-01-02 ENCOUNTER — Ambulatory Visit (HOSPITAL_BASED_OUTPATIENT_CLINIC_OR_DEPARTMENT_OTHER)
Admission: RE | Admit: 2021-01-02 | Discharge: 2021-01-02 | Disposition: A | Discharge status: Home / Self Care | Payer: Managed Care, Other (non HMO) | Attending: Orthopedic Surgery | Admitting: Orthopedic Surgery

## 2021-01-02 ENCOUNTER — Other Ambulatory Visit: Payer: Self-pay

## 2021-01-02 ENCOUNTER — Encounter (HOSPITAL_BASED_OUTPATIENT_CLINIC_OR_DEPARTMENT_OTHER): Payer: Self-pay | Admitting: Orthopedic Surgery

## 2021-01-02 ENCOUNTER — Ambulatory Visit (HOSPITAL_BASED_OUTPATIENT_CLINIC_OR_DEPARTMENT_OTHER): Payer: Managed Care, Other (non HMO) | Admitting: Anesthesiology

## 2021-01-02 ENCOUNTER — Encounter (HOSPITAL_BASED_OUTPATIENT_CLINIC_OR_DEPARTMENT_OTHER)
Admission: RE | Disposition: A | Discharge status: Home / Self Care | Payer: Self-pay | Source: Home / Self Care | Attending: Orthopedic Surgery

## 2021-01-02 DIAGNOSIS — Z89422 Acquired absence of other left toe(s): Secondary | ICD-10-CM | POA: Insufficient documentation

## 2021-01-02 DIAGNOSIS — L97519 Non-pressure chronic ulcer of other part of right foot with unspecified severity: Secondary | ICD-10-CM | POA: Insufficient documentation

## 2021-01-02 DIAGNOSIS — M2041 Other hammer toe(s) (acquired), right foot: Secondary | ICD-10-CM | POA: Insufficient documentation

## 2021-01-02 DIAGNOSIS — Z79899 Other long term (current) drug therapy: Secondary | ICD-10-CM | POA: Insufficient documentation

## 2021-01-02 DIAGNOSIS — E11621 Type 2 diabetes mellitus with foot ulcer: Secondary | ICD-10-CM | POA: Diagnosis not present

## 2021-01-02 DIAGNOSIS — Z7984 Long term (current) use of oral hypoglycemic drugs: Secondary | ICD-10-CM | POA: Insufficient documentation

## 2021-01-02 DIAGNOSIS — L97514 Non-pressure chronic ulcer of other part of right foot with necrosis of bone: Secondary | ICD-10-CM

## 2021-01-02 DIAGNOSIS — Z833 Family history of diabetes mellitus: Secondary | ICD-10-CM | POA: Insufficient documentation

## 2021-01-02 HISTORY — PX: AMPUTATION TOE: SHX6595

## 2021-01-02 LAB — GLUCOSE, CAPILLARY
Glucose-Capillary: 150 mg/dL — ABNORMAL HIGH (ref 70–99)
Glucose-Capillary: 143 mg/dL — ABNORMAL HIGH (ref 70–99)

## 2021-01-02 SURGERY — AMPUTATION, TOE
Anesthesia: Monitor Anesthesia Care | Site: Toe | Laterality: Right

## 2021-01-02 MED ORDER — VANCOMYCIN HCL 500 MG IV SOLR
INTRAVENOUS | Status: AC
  Filled 2021-01-02: qty 10

## 2021-01-02 MED ORDER — PROPOFOL 10 MG/ML IV BOLUS
INTRAVENOUS | Status: AC
  Filled 2021-01-02: qty 20

## 2021-01-02 MED ORDER — OXYCODONE HCL 5 MG/5ML PO SOLN: 5.0000 mg | Freq: Once | ORAL | Status: DC | PRN

## 2021-01-02 MED ORDER — MIDAZOLAM HCL 5 MG/5ML IJ SOLN
INTRAMUSCULAR | Status: DC | PRN
  Administered 2021-01-02: 2 mg via INTRAVENOUS

## 2021-01-02 MED ORDER — PROPOFOL 500 MG/50ML IV EMUL
INTRAVENOUS | Status: DC | PRN
  Administered 2021-01-02: 50 ug/kg/min via INTRAVENOUS

## 2021-01-02 MED ORDER — ONDANSETRON HCL 4 MG/2ML IJ SOLN
INTRAMUSCULAR | Status: DC | PRN
  Administered 2021-01-02: 4 mg via INTRAVENOUS

## 2021-01-02 MED ORDER — OXYCODONE HCL 5 MG PO TABS: 5.0000 mg | ORAL_TABLET | Freq: Once | ORAL | Status: DC | PRN

## 2021-01-02 MED ORDER — SODIUM CHLORIDE 0.9 % IV SOLN: INTRAVENOUS | Status: DC

## 2021-01-02 MED ORDER — LIDOCAINE HCL 2 % IJ SOLN
INTRAMUSCULAR | Status: AC
  Filled 2021-01-02: qty 20

## 2021-01-02 MED ORDER — BUPIVACAINE-EPINEPHRINE (PF) 0.5% -1:200000 IJ SOLN
INTRAMUSCULAR | Status: AC
  Filled 2021-01-02: qty 30

## 2021-01-02 MED ORDER — PROPOFOL 10 MG/ML IV BOLUS
INTRAVENOUS | Status: DC | PRN
  Administered 2021-01-02: 20 mg via INTRAVENOUS
  Administered 2021-01-02: 30 mg via INTRAVENOUS

## 2021-01-02 MED ORDER — ONDANSETRON HCL 4 MG/2ML IJ SOLN: 4.0000 mg | Freq: Once | INTRAMUSCULAR | Status: DC | PRN

## 2021-01-02 MED ORDER — MIDAZOLAM HCL 2 MG/2ML IJ SOLN
INTRAMUSCULAR | Status: AC
  Filled 2021-01-02: qty 2

## 2021-01-02 MED ORDER — VANCOMYCIN HCL 500 MG IV SOLR
INTRAVENOUS | Status: DC | PRN
  Administered 2021-01-02: 500 mg via TOPICAL

## 2021-01-02 MED ORDER — FENTANYL CITRATE (PF) 100 MCG/2ML IJ SOLN
INTRAMUSCULAR | Status: DC | PRN
  Administered 2021-01-02 (×2): 50 ug via INTRAVENOUS

## 2021-01-02 MED ORDER — CEFAZOLIN SODIUM-DEXTROSE 2-4 GM/100ML-% IV SOLN
2.0000 g | INTRAVENOUS | Status: AC
  Administered 2021-01-02: 2 g via INTRAVENOUS

## 2021-01-02 MED ORDER — AMISULPRIDE (ANTIEMETIC) 5 MG/2ML IV SOLN: 10.0000 mg | Freq: Once | INTRAVENOUS | Status: DC | PRN

## 2021-01-02 MED ORDER — LACTATED RINGERS IV SOLN: INTRAVENOUS | Status: DC

## 2021-01-02 MED ORDER — BUPIVACAINE-EPINEPHRINE 0.5% -1:200000 IJ SOLN
INTRAMUSCULAR | Status: DC | PRN
  Administered 2021-01-02: 10 mL

## 2021-01-02 MED ORDER — CEFAZOLIN SODIUM-DEXTROSE 2-4 GM/100ML-% IV SOLN
INTRAVENOUS | Status: AC
  Filled 2021-01-02: qty 100

## 2021-01-02 MED ORDER — ACETAMINOPHEN 10 MG/ML IV SOLN: 1000.0000 mg | Freq: Once | INTRAVENOUS | Status: DC | PRN

## 2021-01-02 MED ORDER — LIDOCAINE 2% (20 MG/ML) 5 ML SYRINGE
INTRAMUSCULAR | Status: AC
  Filled 2021-01-02: qty 5

## 2021-01-02 MED ORDER — FENTANYL CITRATE (PF) 100 MCG/2ML IJ SOLN
INTRAMUSCULAR | Status: AC
  Filled 2021-01-02: qty 2

## 2021-01-02 MED ORDER — VANCOMYCIN HCL 1000 MG IV SOLR
INTRAVENOUS | Status: AC
  Filled 2021-01-02: qty 20

## 2021-01-02 MED ORDER — ONDANSETRON HCL 4 MG/2ML IJ SOLN
INTRAMUSCULAR | Status: AC
  Filled 2021-01-02: qty 2

## 2021-01-02 MED ORDER — HYDROMORPHONE HCL 1 MG/ML IJ SOLN: 0.2500 mg | INTRAMUSCULAR | Status: DC | PRN

## 2021-01-02 MED ORDER — 0.9 % SODIUM CHLORIDE (POUR BTL) OPTIME
TOPICAL | Status: DC | PRN
  Administered 2021-01-02: 400 mL

## 2021-01-02 SURGICAL SUPPLY — 61 items
APL PRP STRL LF DISP 70% ISPRP (MISCELLANEOUS) ×1
BLADE AVERAGE 25X9 (BLADE) IMPLANT
BLADE OSC/SAG .038X5.5 CUT EDG (BLADE) IMPLANT
BLADE SURG 10 STRL SS (BLADE) ×2 IMPLANT
BLADE SURG 15 STRL LF DISP TIS (BLADE) ×2 IMPLANT
BLADE SURG 15 STRL SS (BLADE) ×4
BNDG CMPR 9X4 STRL LF SNTH (GAUZE/BANDAGES/DRESSINGS) ×1
BNDG COHESIVE 4X5 TAN ST LF (GAUZE/BANDAGES/DRESSINGS) ×2 IMPLANT
BNDG CONFORM 3 STRL LF (GAUZE/BANDAGES/DRESSINGS) ×2 IMPLANT
BNDG ESMARK 4X9 LF (GAUZE/BANDAGES/DRESSINGS) ×2 IMPLANT
CHLORAPREP W/TINT 26 (MISCELLANEOUS) ×2 IMPLANT
CORD BIPOLAR FORCEPS 12FT (ELECTRODE) ×2 IMPLANT
COVER BACK TABLE 60X90IN (DRAPES) ×2 IMPLANT
CUFF TOURN SGL QUICK 24 (TOURNIQUET CUFF)
CUFF TRNQT CYL 24X4X16.5-23 (TOURNIQUET CUFF) IMPLANT
DECANTER SPIKE VIAL GLASS SM (MISCELLANEOUS) IMPLANT
DRAPE EXTREMITY T 121X128X90 (DISPOSABLE) ×2 IMPLANT
DRAPE SURG 17X23 STRL (DRAPES) ×2 IMPLANT
DRAPE U-SHAPE 47X51 STRL (DRAPES) ×2 IMPLANT
DRSG EMULSION OIL 3X3 NADH (GAUZE/BANDAGES/DRESSINGS) IMPLANT
DRSG MEPITEL 4X7.2 (GAUZE/BANDAGES/DRESSINGS) ×2 IMPLANT
DRSG PAD ABDOMINAL 8X10 ST (GAUZE/BANDAGES/DRESSINGS) IMPLANT
ELECT REM PT RETURN 9FT ADLT (ELECTROSURGICAL)
ELECTRODE REM PT RTRN 9FT ADLT (ELECTROSURGICAL) IMPLANT
GAUZE SPONGE 4X4 12PLY STRL (GAUZE/BANDAGES/DRESSINGS) ×2 IMPLANT
GLOVE SRG 8 PF TXTR STRL LF DI (GLOVE) ×3 IMPLANT
GLOVE SURG ENC MOIS LTX SZ8 (GLOVE) ×2 IMPLANT
GLOVE SURG LTX SZ8 (GLOVE) ×2 IMPLANT
GLOVE SURG UNDER POLY LF SZ8 (GLOVE) ×6
GOWN STRL REUS W/ TWL LRG LVL3 (GOWN DISPOSABLE) IMPLANT
GOWN STRL REUS W/ TWL XL LVL3 (GOWN DISPOSABLE) ×2 IMPLANT
GOWN STRL REUS W/TWL 2XL LVL3 (GOWN DISPOSABLE) ×2 IMPLANT
GOWN STRL REUS W/TWL LRG LVL3 (GOWN DISPOSABLE)
GOWN STRL REUS W/TWL XL LVL3 (GOWN DISPOSABLE) ×4
NDL SAFETY ECLIPSE 18X1.5 (NEEDLE) ×1 IMPLANT
NEEDLE HYPO 18GX1.5 SHARP (NEEDLE) ×2
NEEDLE HYPO 25X1 1.5 SAFETY (NEEDLE) ×2 IMPLANT
NS IRRIG 1000ML POUR BTL (IV SOLUTION) ×2 IMPLANT
PACK BASIN DAY SURGERY FS (CUSTOM PROCEDURE TRAY) ×2 IMPLANT
PAD CAST 4YDX4 CTTN HI CHSV (CAST SUPPLIES) ×1 IMPLANT
PADDING CAST COTTON 4X4 STRL (CAST SUPPLIES) ×2
PENCIL SMOKE EVACUATOR (MISCELLANEOUS) IMPLANT
SANITIZER HAND PURELL 535ML FO (MISCELLANEOUS) ×2 IMPLANT
SHEET MEDIUM DRAPE 40X70 STRL (DRAPES) ×2 IMPLANT
SLEEVE SCD COMPRESS KNEE MED (STOCKING) ×2 IMPLANT
SPONGE T-LAP 18X18 ~~LOC~~+RFID (SPONGE) ×2 IMPLANT
STOCKINETTE 6  STRL (DRAPES) ×2
STOCKINETTE 6 STRL (DRAPES) ×1 IMPLANT
SUCTION FRAZIER HANDLE 10FR (MISCELLANEOUS)
SUCTION TUBE FRAZIER 10FR DISP (MISCELLANEOUS) IMPLANT
SUT ETHILON 2 0 FS 18 (SUTURE) IMPLANT
SUT ETHILON 2 0 FSLX (SUTURE) ×4 IMPLANT
SUT ETHILON 3 0 PS 1 (SUTURE) IMPLANT
SUT MNCRL AB 3-0 PS2 18 (SUTURE) IMPLANT
SWAB COLLECTION DEVICE MRSA (MISCELLANEOUS) IMPLANT
SWAB CULTURE ESWAB REG 1ML (MISCELLANEOUS) IMPLANT
SYR BULB EAR ULCER 3OZ GRN STR (SYRINGE) ×2 IMPLANT
SYR CONTROL 10ML LL (SYRINGE) ×2 IMPLANT
TOWEL GREEN STERILE FF (TOWEL DISPOSABLE) ×2 IMPLANT
TUBE CONNECTING 20X1/4 (TUBING) IMPLANT
UNDERPAD 30X36 HEAVY ABSORB (UNDERPADS AND DIAPERS) ×2 IMPLANT

## 2021-01-02 NOTE — Transfer of Care (Signed)
Immediate Anesthesia Transfer of Care Note  Patient: Jenny Newton  Procedure(s) Performed: Right 3rd and 4th toe amputations at the middle phalanx (Right: Toe)  Patient Location: PACU  Anesthesia Type:MAC  Level of Consciousness: sedated  Airway & Oxygen Therapy: Patient Spontanous Breathing and Patient connected to face mask oxygen  Post-op Assessment: Report given to RN and Post -op Vital signs reviewed and stable  Post vital signs: Reviewed and stable  Last Vitals:  Vitals Value Taken Time  BP 99/55 01/02/21 1106  Temp    Pulse 43 01/02/21 1107  Resp 15 01/02/21 1107  SpO2 98 % 01/02/21 1107  Vitals shown include unvalidated device data.  Last Pain:  Vitals:   01/02/21 0850  TempSrc: Oral  PainSc: 0-No pain         Complications: No notable events documented.

## 2021-01-02 NOTE — Discharge Instructions (Addendum)
  Post Anesthesia Home Care Instructions  Activity: Get plenty of rest for the remainder of the day. A responsible individual must stay with you for 24 hours following the procedure.  For the next 24 hours, DO NOT: -Drive a car -Advertising copywriter -Drink alcoholic beverages -Take any medication unless instructed by your physician -Make any legal decisions or sign important papers.  Meals: Start with liquid foods such as gelatin or soup. Progress to regular foods as tolerated. Avoid greasy, spicy, heavy foods. If nausea and/or vomiting occur, drink only clear liquids until the nausea and/or vomiting subsides. Call your physician if vomiting continues.  Special Instructions/Symptoms: Your throat may feel dry or sore from the anesthesia or the breathing tube placed in your throat during surgery. If this causes discomfort, gargle with warm salt water. The discomfort should disappear within 24 hours.  If you had a scopolamine patch placed behind your ear for the management of post- operative nausea and/or vomiting:  1. The medication in the patch is effective for 72 hours, after which it should be removed.  Wrap patch in a tissue and discard in the trash. Wash hands thoroughly with soap and water. 2. You may remove the patch earlier than 72 hours if you experience unpleasant side effects which may include dry mouth, dizziness or visual disturbances. 3. Avoid touching the patch. Wash your hands with soap and water after contact with the patch.     Toni Arthurs, MD [facility]  Please read the following information regarding your care after surgery.  Medications  You only need a prescription for the narcotic pain medicine (ex. oxycodone, Percocet, Norco).  All of the other medicines listed below are available over the counter. X Aleve 2 pills twice a day for the first 3 days after surgery. X acetominophen (Tylenol) 650 mg every 4-6 hours as you need for minor to moderate pain   Weight  Bearing X Bear weight only on your operated foot in the post-op shoe.   Cast / Splint / Dressing X Keep your splint, cast or dressing clean and dry.  Don't put anything (coat hanger, pencil, etc) down inside of it.  If it gets damp, use a hair dryer on the cool setting to dry it.  If it gets soaked, call the office to schedule an appointment for a cast change.   After your dressing, cast or splint is removed; you may shower, but do not soak or scrub the wound.  Allow the water to run over it, and then gently pat it dry.  Swelling It is normal for you to have swelling where you had surgery.  To reduce swelling and pain, keep your toes above your nose for at least 3 days after surgery.  It may be necessary to keep your foot or leg elevated for several weeks.  If it hurts, it should be elevated.  Follow Up Call my office at [phone] when you are discharged from the hospital or surgery center to schedule an appointment to be seen two weeks after surgery.  Call my office at [phone] if you develop a fever >101.5 F, nausea, vomiting, bleeding from the surgical site or severe pain.

## 2021-01-02 NOTE — H&P (Signed)
Jenny Newton is an 55 y.o. female.   Chief Complaint: Right third toe diabetic ulcer HPI: 55 year old female with a past medical history significant for diabetes complains of a chronic ulcer at the right third toe.  She intermittently has had an ulcer at the fourth toe as well.  She has severe hammertoe deformities and has failed nonoperative treatment to date.  She presents today for surgical treatment of these nonhealing ulcers.  Past Medical History:  Diagnosis Date   Anemia    Diabetes mellitus without complication (HCC)    Glaucoma    Hypertension    Osteomyelitis (HCC)    left 3rd toe    Past Surgical History:  Procedure Laterality Date   AMPUTATION TOE Left 06/20/2020   Procedure: Left 3rd toe amputation;  Surgeon: Toni Arthurs, MD;  Location: [facility];  Service: Orthopedics;  Laterality: Left;    CATARACT EXTRACTION, BILATERAL     eue surgery  2018   EYE SURGERY  2019   left breast surgery  2009    Family History  Problem Relation Age of Onset   Hypertension Father    Diabetes Father    Social History:  reports that she has never smoked. She has never used smokeless tobacco. She reports that she does not currently use alcohol. She reports that she does not currently use drugs.  Allergies: No Known Allergies  Medications Prior to Admission  Medication Sig Dispense Refill   amLODipine (NORVASC) 10 MG tablet Take 10 mg by mouth daily.     Ascorbic Acid (VITAMIN C) 1000 MG tablet Take 1,000 mg by mouth daily.     Brinzolamide-Brimonidine (SIMBRINZA) 1-0.2 % SUSP Apply to eye.     ferrous sulfate 324 MG TBEC Take 324 mg by mouth.     glyBURIDE (DIABETA) 5 MG tablet Take 5 mg by mouth 2 (two) times daily.     losartan-hydrochlorothiazide (HYZAAR) 50-12.5 MG tablet Take 1 tablet by mouth daily.     metFORMIN (GLUCOPHAGE) 1000 MG tablet SMARTSIG:1 Tablet(s) By Mouth Morning-Evening     Netarsudil-Latanoprost (ROCKLATAN) 0.02-0.005 % SOLN Apply to eye.      rosuvastatin (CRESTOR) 10 MG tablet Take 10 mg by mouth daily.      Results for orders placed or performed during the hospital encounter of 01/02/21 (from the past 48 hour(s))  Glucose, capillary     Status: Abnormal   Collection Time: 01/02/21  8:44 AM  Result Value Ref Range   Glucose-Capillary 150 (H) 70 - 99 mg/dL    Comment: Glucose reference range applies only to samples taken after fasting for at least 8 hours.   Comment 1 Notify RN    Comment 2 Document in Chart    No results found.  Review of Systems no recent fever, chills, nausea, vomiting or changes in her appetite.  Blood pressure (!) 153/67, pulse (!) 55, temperature 98.5 F (36.9 C), temperature source Oral, resp. rate 18, height 5\' 8"  (1.727 m), weight 107.1 kg, SpO2 100 %. Physical Exam  Well-nourished well-developed woman in no apparent distress.  Alert and oriented.  Normal mood and affect.  Gait is somewhat wide-based and shuffling.  Third and fourth toes of the right foot have severe hammertoe deformities.  Chronic ulcer at the tip of the third toe and beneath the fourth toe at the flexion crease distally.  No cellulitis is evident.  Pulses are palpable in the foot.  Diminished sensibility to light touch dorsally and plantarly at  the forefoot.  The toes cannot be passively corrected.   Assessment/Plan Right third and fourth fixed hammertoe deformities with chronic diabetic ulcers -to the operating room today for right foot third and fourth toe amputations.  The risks and benefits of the alternative treatment options have been discussed in detail.  The patient wishes to proceed with surgery and specifically understands risks of bleeding, infection, nerve damage, blood clots, need for additional surgery, amputation and death.   Toni Arthurs, MD 2021-01-16, 9:02 AM

## 2021-01-02 NOTE — Anesthesia Postprocedure Evaluation (Signed)
Anesthesia Post Note  Patient: Jenny Newton  Procedure(s) Performed: Right 3rd and 4th toe amputations at the middle phalanx (Right: Toe)     Patient location during evaluation: PACU Anesthesia Type: MAC Level of consciousness: awake and alert Pain management: pain level controlled Vital Signs Assessment: post-procedure vital signs reviewed and stable Respiratory status: spontaneous breathing, nonlabored ventilation, respiratory function stable and patient connected to nasal cannula oxygen Cardiovascular status: stable and blood pressure returned to baseline Postop Assessment: no apparent nausea or vomiting Anesthetic complications: no   No notable events documented.  Last Vitals:  Vitals:   01/02/21 1145 01/02/21 1210  BP: (!) 144/67 (!) 134/59  Pulse: (!) 47 (!) 50  Resp: 10 12  Temp:  36.4 C  SpO2: 95% 95%    Last Pain:  Vitals:   01/02/21 1210  TempSrc:   PainSc: 0-No pain                 Barnet Glasgow

## 2021-01-02 NOTE — Op Note (Signed)
01/02/2021  11:15 AM  PATIENT:  Jenny Newton  55 y.o. female  PRE-OPERATIVE DIAGNOSIS:  right diabetic foot ulcer  POST-OPERATIVE DIAGNOSIS:  right diabetic foot ulcer  Procedure(s):  Right 3rd and 4th toe amputations at the middle phalanx  SURGEON:  Wylene Simmer, MD  ASSISTANT: Mechele Claude, PA-C  ANESTHESIA:   mac, regional  EBL:  minimal   TOURNIQUET:   Total Tourniquet Time Documented: Calf (Right) - 16 minutes Total: Calf (Right) - 16 minutes  COMPLICATIONS:  None apparent  DISPOSITION:  Extubated, awake and stable to recovery.  INDICATION FOR PROCEDURE: 55 year old female with a history of diabetes complicated by nonhealing ulcers of her right foot third and fourth toes.  She has fixed hammertoe deformities of both toes as well.  She has failed nonoperative treatment with extensive wound care, activity and shoewear modification.  She presents now for her third and fourth toe amputations.  The risks and benefits of the alternative treatment options have been discussed in detail.  The patient wishes to proceed with surgery and specifically understands risks of bleeding, infection, nerve damage, blood clots, need for additional surgery, amputation and death.   PROCEDURE IN DETAIL: After preoperative consent was obtained and the correct operative site was identified, the patient was brought the operating room and placed supine on the operating table.  IV sedation was administered along with preoperative antibiotics.  A surgical timeout was taken.  Metatarsal blocks were performed with Marcaine with epinephrine.  The right lower extremity was prepped and draped in standard sterile fashion.  The foot was exsanguinated and a 4 inch Esmarch tourniquet wrapped around the ankle.  [place] incision was marked on the second toe just distal from the PIP joint.  The incision was made and dissection carried down through the subcutaneous tissues.  The toe was disarticulated through the proximal  interphalangeal joint.  The head of the proximal phalanx was resected with a bone cutter.  The wound was irrigated.  Neurovascular bundles were cauterized.  Vancomycin powder was sprinkled in the wound.  The incision was closed with horizontal mattress sutures of 2-0 nylon.  Same procedure was then performed for the fourth toe again removing the toe through the PIP joint and resecting the head of the proximal phalanx.  Sterile dressings were applied followed by compression wrap.  The tourniquet was released after application of the dressings.  The patient was awakened from anesthesia and transported to the recovery room in stable condition.   FOLLOW UP PLAN: Weightbearing as tolerated in a flat postop shoe.  Follow-up in the office in 2 weeks for suture removal.  No indication for DVT prophylaxis in this ambulatory patient.    Mechele Claude PA-C was present and scrubbed for the duration of the operative case. His assistance was essential in positioning the patient, prepping and draping, gaining and maintaining exposure, performing the operation, closing and dressing the wounds and applying the splint.

## 2021-01-03 ENCOUNTER — Encounter (HOSPITAL_BASED_OUTPATIENT_CLINIC_OR_DEPARTMENT_OTHER): Payer: Self-pay | Admitting: Orthopedic Surgery

## 2021-01-03 NOTE — Progress Notes (Signed)
Has F/U appointment scheduled with Dr. Victorino Dike. Encouraged to keep this appointment. Verb she was pleased with care rec'd and has had no pain or other complications.

## 2022-04-10 IMAGING — MG DIGITAL SCREENING BILAT W/ TOMO W/ CAD
8 series · 8 of 24 positions shown · non-contrast
Comparison: Previous exam(s).

CLINICAL DATA: Screening.

EXAM:
DIGITAL SCREENING BILATERAL MAMMOGRAM WITH TOMO AND CAD

[R MLO synth-2D (1 of 2)]
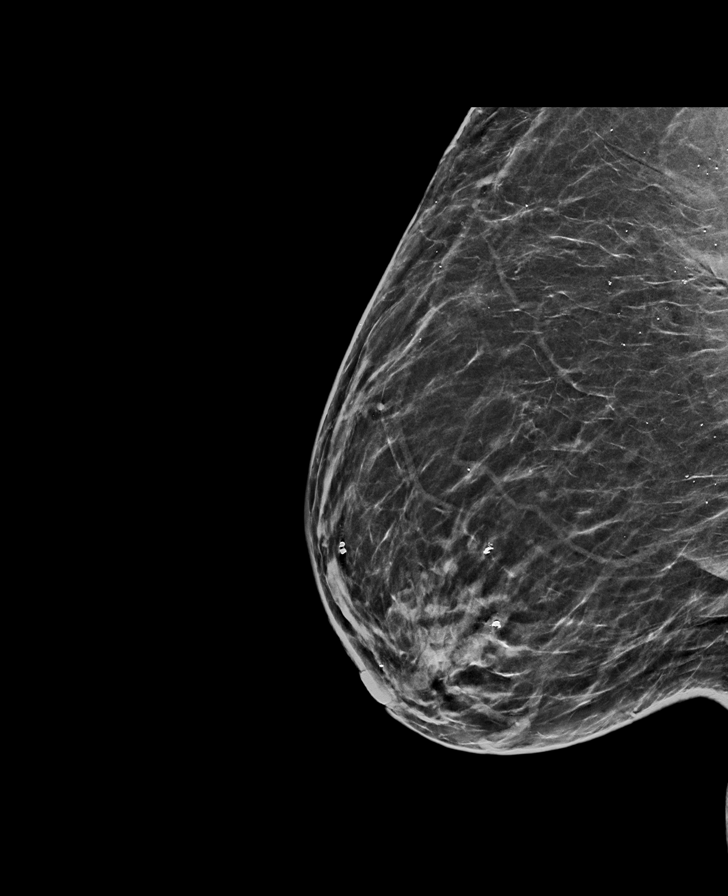

[L MLO synth-2D]
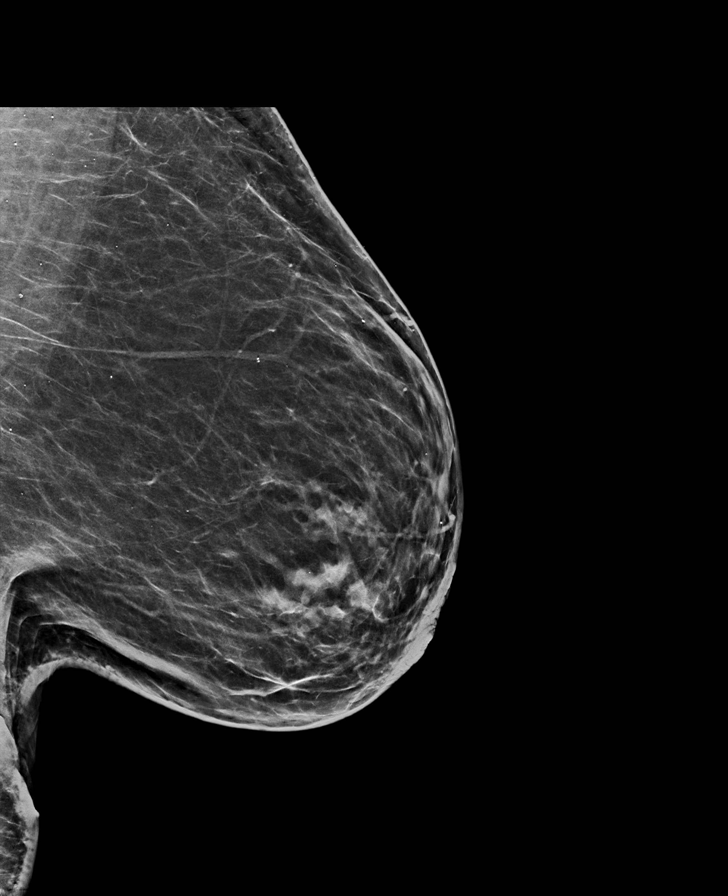

[L CC synth-2D]
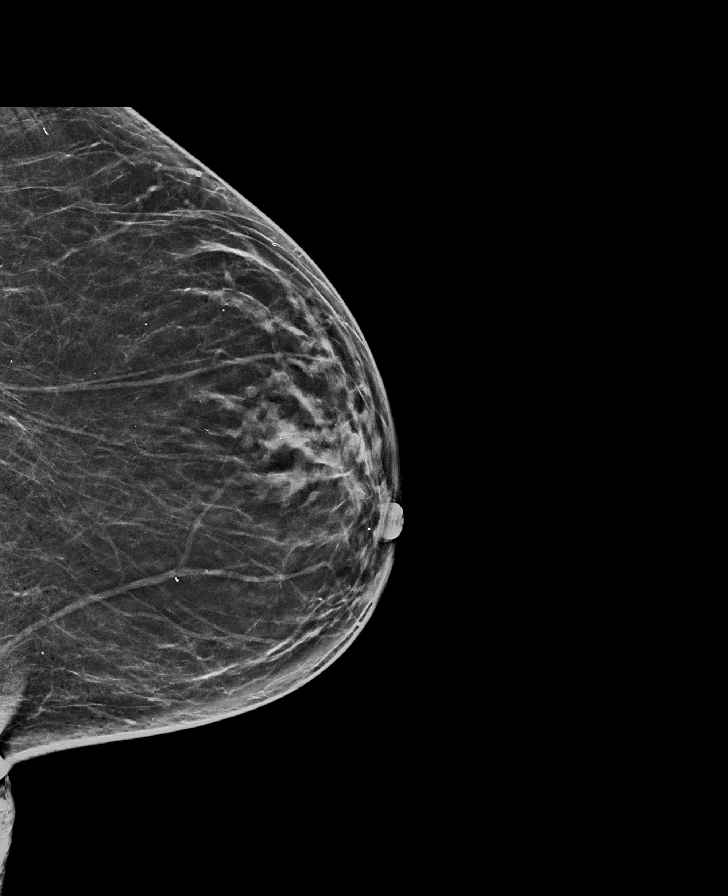

[R MLO synth-2D (2 of 2)]
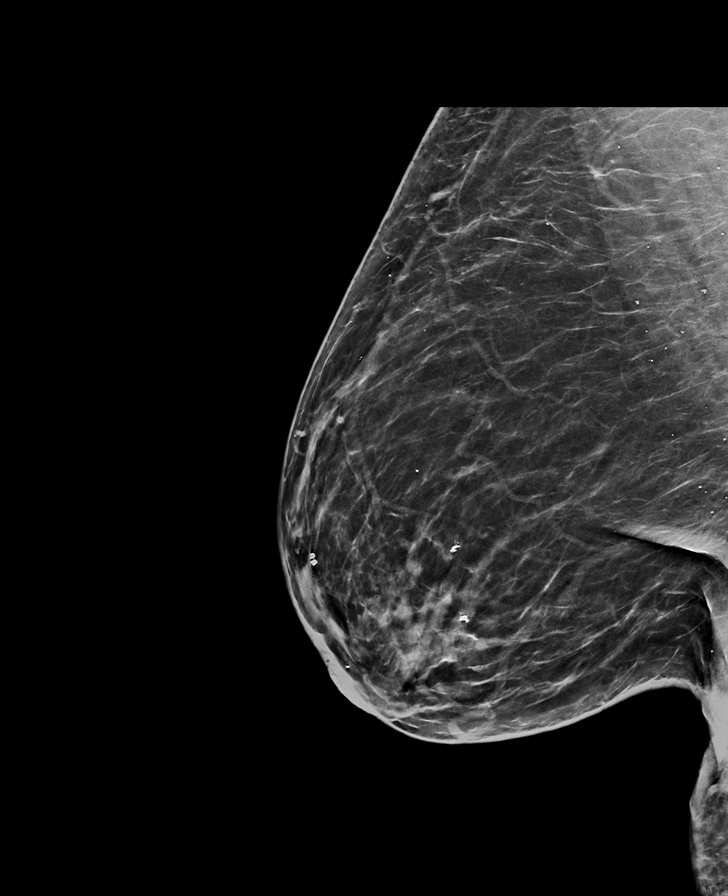

[L MLO tomo · tomo slice 35/69.0]
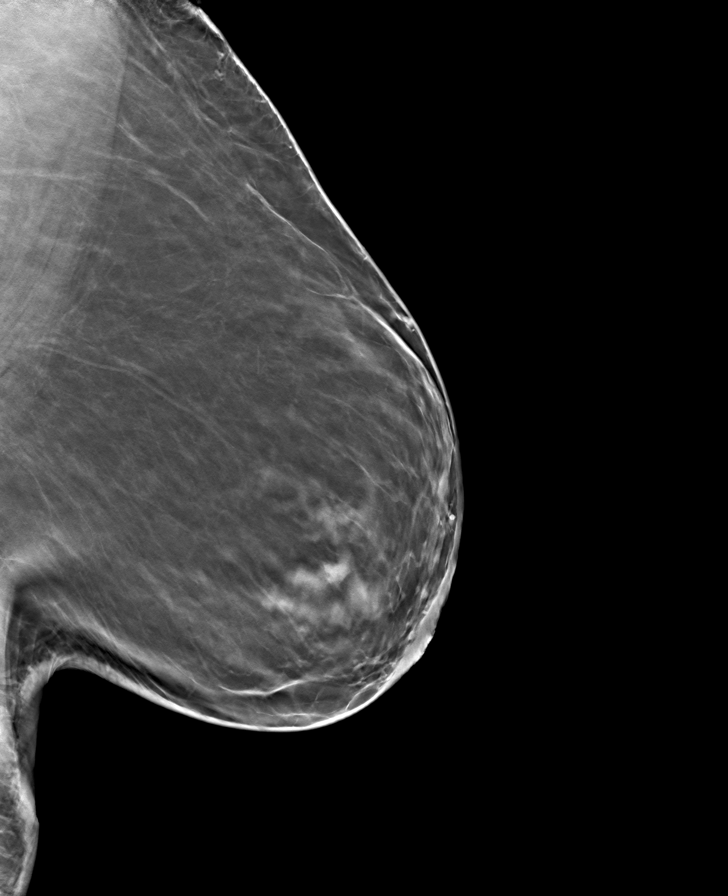

[R MLO tomo (1 of 2) · tomo slice 34/67.0]
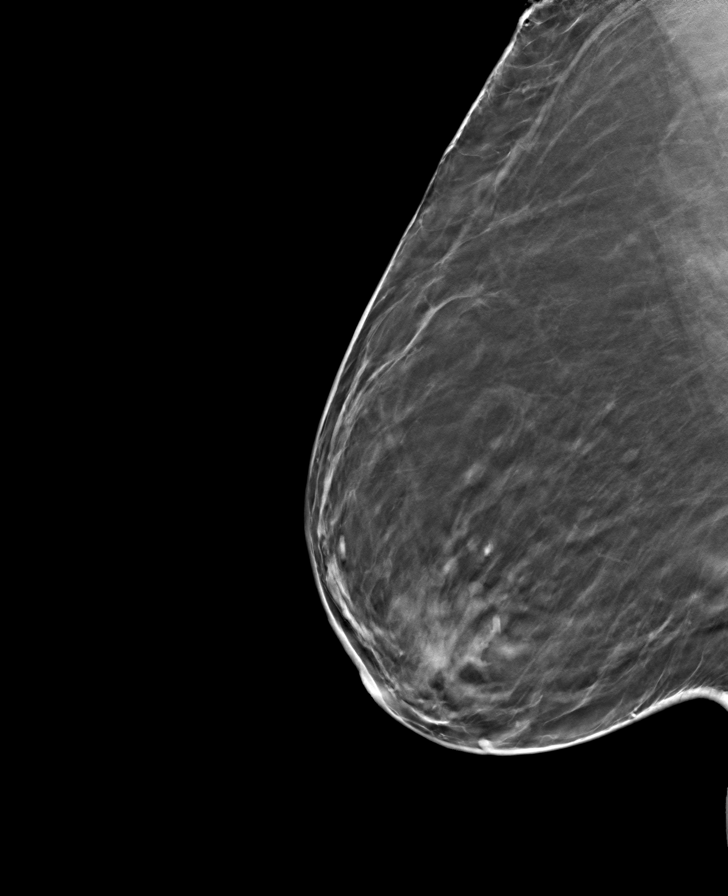

[L CC tomo · tomo slice 31/62.0]
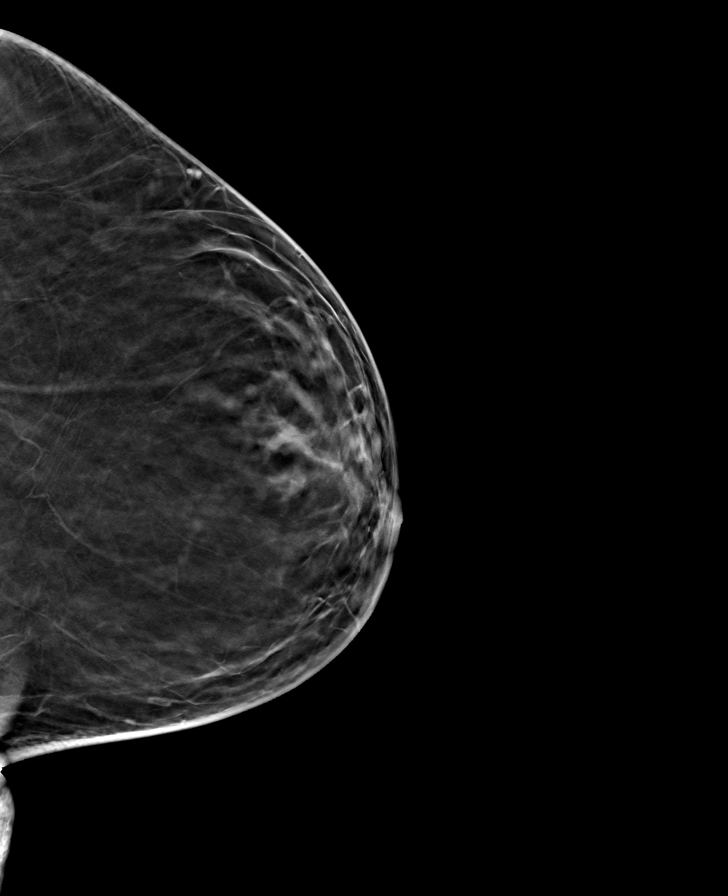

[R MLO tomo (2 of 2) · tomo slice 37/72.0]
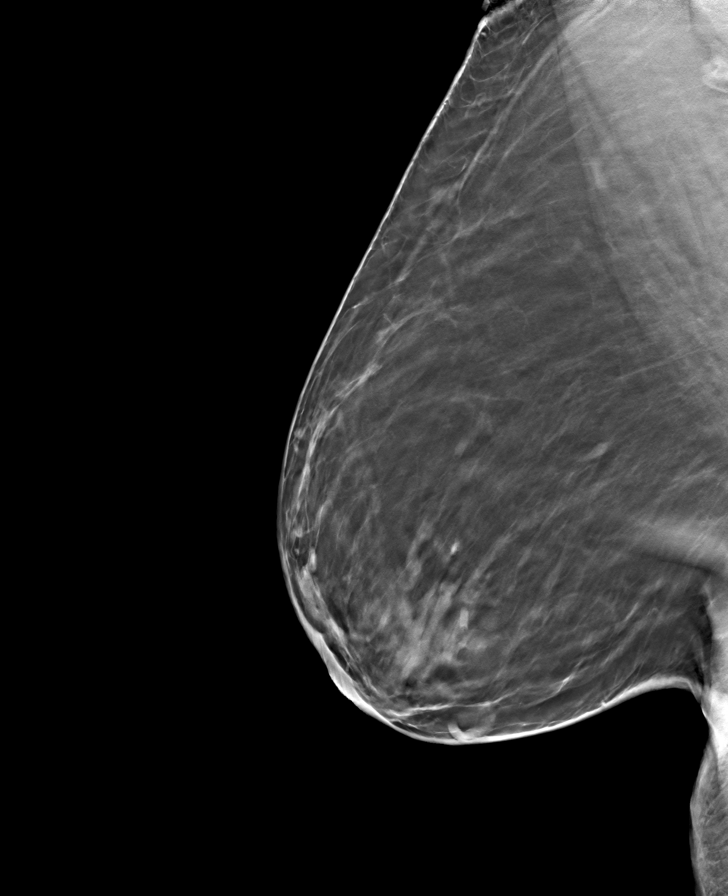

[8 of 24 positions shown; findings below may reference images not displayed]

ACR Breast Density Category b: There are scattered areas of
fibroglandular density.
FINDINGS: There are no findings suspicious for malignancy. Images were
processed with CAD.
IMPRESSION: No mammographic evidence of malignancy. A result letter of this
screening mammogram will be mailed directly to the patient.

RECOMMENDATION:
Screening mammogram in one year. (Code:[id number])

BI-RADS CATEGORY  1: Negative.

## 2022-09-25 LAB — BASIC METABOLIC PANEL
BUN: 34 — AB (ref 4–21)
Creatinine: 1.5 — AB (ref 0.5–1.1)
Glucose: 117

## 2022-09-25 LAB — VITAMIN D 25 HYDROXY (VIT D DEFICIENCY, FRACTURES): Vit D, 25-Hydroxy: 23.1

## 2022-09-25 LAB — HEMOGLOBIN A1C: Hemoglobin A1C: 8.7

## 2022-09-25 LAB — LIPID PANEL
LDL Cholesterol: 78
Triglycerides: 114 (ref 40–160)

## 2022-09-25 LAB — MICROALBUMIN / CREATININE URINE RATIO: Microalb Creat Ratio: 7

## 2022-09-25 LAB — TSH: TSH: 2.54 (ref 0.41–5.90)

## 2022-09-25 LAB — COMPREHENSIVE METABOLIC PANEL: eGFR: 40

## 2022-10-29 ENCOUNTER — Observation Stay (HOSPITAL_COMMUNITY)
Admission: EM | Admit: 2022-10-29 | Discharge: 2022-10-30 | Disposition: A | Discharge status: Home / Self Care | Payer: Managed Care, Other (non HMO) | Attending: Family Medicine | Admitting: Family Medicine

## 2022-10-29 ENCOUNTER — Encounter (HOSPITAL_COMMUNITY): Payer: Self-pay

## 2022-10-29 ENCOUNTER — Emergency Department (HOSPITAL_COMMUNITY): Payer: Managed Care, Other (non HMO)

## 2022-10-29 ENCOUNTER — Other Ambulatory Visit: Payer: Self-pay

## 2022-10-29 DIAGNOSIS — E1140 Type 2 diabetes mellitus with diabetic neuropathy, unspecified: Secondary | ICD-10-CM | POA: Insufficient documentation

## 2022-10-29 DIAGNOSIS — G459 Transient cerebral ischemic attack, unspecified: Principal | ICD-10-CM | POA: Diagnosis present

## 2022-10-29 DIAGNOSIS — I10 Essential (primary) hypertension: Secondary | ICD-10-CM | POA: Diagnosis not present

## 2022-10-29 DIAGNOSIS — W938XXA Exposure to other excessive cold of man-made origin, initial encounter: Secondary | ICD-10-CM | POA: Diagnosis not present

## 2022-10-29 DIAGNOSIS — Z7984 Long term (current) use of oral hypoglycemic drugs: Secondary | ICD-10-CM | POA: Diagnosis not present

## 2022-10-29 DIAGNOSIS — Z89422 Acquired absence of other left toe(s): Secondary | ICD-10-CM | POA: Insufficient documentation

## 2022-10-29 DIAGNOSIS — E119 Type 2 diabetes mellitus without complications: Secondary | ICD-10-CM

## 2022-10-29 DIAGNOSIS — T68XXXA Hypothermia, initial encounter: Secondary | ICD-10-CM | POA: Insufficient documentation

## 2022-10-29 DIAGNOSIS — R531 Weakness: Secondary | ICD-10-CM | POA: Diagnosis present

## 2022-10-29 DIAGNOSIS — Z79899 Other long term (current) drug therapy: Secondary | ICD-10-CM | POA: Diagnosis not present

## 2022-10-29 DIAGNOSIS — R21 Rash and other nonspecific skin eruption: Secondary | ICD-10-CM | POA: Diagnosis present

## 2022-10-29 LAB — PROTIME-INR
INR: 1.1 (ref 0.8–1.2)
Prothrombin Time: 14.6 seconds (ref 11.4–15.2)

## 2022-10-29 LAB — CBC
HCT: 35.3 % — ABNORMAL LOW (ref 36.0–46.0)
Hemoglobin: 11.7 g/dL — ABNORMAL LOW (ref 12.0–15.0)
MCH: 26.8 pg (ref 26.0–34.0)
MCHC: 33.1 g/dL (ref 30.0–36.0)
MCV: 80.8 fL (ref 80.0–100.0)
Platelets: 231 10*3/uL (ref 150–400)
RBC: 4.37 MIL/uL (ref 3.87–5.11)
RDW: 14.6 % (ref 11.5–15.5)
WBC: 5.6 10*3/uL (ref 4.0–10.5)
nRBC: 0 % (ref 0.0–0.2)

## 2022-10-29 LAB — I-STAT CHEM 8, ED
BUN: 29 mg/dL — ABNORMAL HIGH (ref 6–20)
Calcium, Ion: 1.19 mmol/L (ref 1.15–1.40)
Chloride: 110 mmol/L (ref 98–111)
Creatinine, Ser: 1.5 mg/dL — ABNORMAL HIGH (ref 0.44–1.00)
Glucose, Bld: 70 mg/dL (ref 70–99)
HCT: 38 % (ref 36.0–46.0)
Hemoglobin: 12.9 g/dL (ref 12.0–15.0)
Potassium: 3.1 mmol/L — ABNORMAL LOW (ref 3.5–5.1)
Sodium: 143 mmol/L (ref 135–145)
TCO2: 22 mmol/L (ref 22–32)

## 2022-10-29 LAB — COMPREHENSIVE METABOLIC PANEL
ALT: 27 U/L (ref 0–44)
AST: 30 U/L (ref 15–41)
Albumin: 3.6 g/dL (ref 3.5–5.0)
Alkaline Phosphatase: 57 U/L (ref 38–126)
Anion gap: 10 (ref 5–15)
BUN: 31 mg/dL — ABNORMAL HIGH (ref 6–20)
CO2: 22 mmol/L (ref 22–32)
Calcium: 9 mg/dL (ref 8.9–10.3)
Chloride: 108 mmol/L (ref 98–111)
Creatinine, Ser: 1.45 mg/dL — ABNORMAL HIGH (ref 0.44–1.00)
GFR, Estimated: 42 mL/min — ABNORMAL LOW (ref >60)
Glucose, Bld: 76 mg/dL (ref 70–99)
Potassium: 3.1 mmol/L — ABNORMAL LOW (ref 3.5–5.1)
Sodium: 140 mmol/L (ref 135–145)
Total Bilirubin: 0.6 mg/dL (ref 0.3–1.2)
Total Protein: 8 g/dL (ref 6.5–8.1)

## 2022-10-29 LAB — GLUCOSE, CAPILLARY: Glucose-Capillary: 108 mg/dL — ABNORMAL HIGH (ref 70–99)

## 2022-10-29 LAB — CBG MONITORING, ED
Glucose-Capillary: 117 mg/dL — ABNORMAL HIGH (ref 70–99)
Glucose-Capillary: 68 mg/dL — ABNORMAL LOW (ref 70–99)

## 2022-10-29 LAB — DIFFERENTIAL
Abs Immature Granulocytes: 0.01 10*3/uL (ref 0.00–0.07)
Basophils Absolute: 0 10*3/uL (ref 0.0–0.1)
Basophils Relative: 0 %
Eosinophils Absolute: 0 10*3/uL (ref 0.0–0.5)
Eosinophils Relative: 0 %
Immature Granulocytes: 0 %
Lymphocytes Relative: 11 %
Lymphs Abs: 0.6 10*3/uL — ABNORMAL LOW (ref 0.7–4.0)
Monocytes Absolute: 0.6 10*3/uL (ref 0.1–1.0)
Monocytes Relative: 11 %
Neutro Abs: 4.4 10*3/uL (ref 1.7–7.7)
Neutrophils Relative %: 78 %

## 2022-10-29 LAB — LACTIC ACID, PLASMA
Lactic Acid, Venous: 1.3 mmol/L (ref 0.5–1.9)
Lactic Acid, Venous: 0.7 mmol/L (ref 0.5–1.9)

## 2022-10-29 LAB — MRSA NEXT GEN BY PCR, NASAL: MRSA by PCR Next Gen: NOT DETECTED

## 2022-10-29 LAB — CULTURE, BLOOD (ROUTINE X 2)

## 2022-10-29 LAB — ETHANOL: Alcohol, Ethyl (B): <10 mg/dL (ref <10)

## 2022-10-29 LAB — APTT: aPTT: 30 seconds (ref 24–36)

## 2022-10-29 LAB — HEMOGLOBIN A1C: Hemoglobin A1C: 7.5

## 2022-10-29 LAB — SEDIMENTATION RATE: Sed Rate: 30 mm/hr — ABNORMAL HIGH (ref 0–22)

## 2022-10-29 MED ORDER — INSULIN ASPART 100 UNIT/ML IJ SOLN: 0.0000 [IU] | Freq: Three times a day (TID) | INTRAMUSCULAR | Status: DC

## 2022-10-29 MED ORDER — ASPIRIN 325 MG PO TABS
325.0000 mg | ORAL_TABLET | Freq: Once | ORAL | Status: AC
  Administered 2022-10-29: 325 mg via ORAL
  Filled 2022-10-29: qty 1

## 2022-10-29 MED ORDER — IOHEXOL 350 MG/ML SOLN
75.0000 mL | Freq: Once | INTRAVENOUS | Status: AC | PRN
  Administered 2022-10-29: 75 mL via INTRAVENOUS

## 2022-10-29 MED ORDER — CLOPIDOGREL BISULFATE 75 MG PO TABS
300.0000 mg | ORAL_TABLET | Freq: Once | ORAL | Status: AC
  Administered 2022-10-29: 300 mg via ORAL
  Filled 2022-10-29: qty 4

## 2022-10-29 MED ORDER — DEXTROSE 50 % IV SOLN
25.0000 mL | Freq: Once | INTRAVENOUS | Status: AC
  Administered 2022-10-29: 25 mL via INTRAVENOUS

## 2022-10-29 MED ORDER — SENNOSIDES-DOCUSATE SODIUM 8.6-50 MG PO TABS: 1.0000 | ORAL_TABLET | Freq: Every evening | ORAL | Status: DC | PRN

## 2022-10-29 MED ORDER — CHLORHEXIDINE GLUCONATE CLOTH 2 % EX PADS
6.0000 | MEDICATED_PAD | Freq: Every day | CUTANEOUS | Status: DC
  Administered 2022-10-29 – 2022-10-30 (×2): 6 via TOPICAL

## 2022-10-29 MED ORDER — ACETAMINOPHEN 650 MG RE SUPP: 650.0000 mg | RECTAL | Status: DC | PRN

## 2022-10-29 MED ORDER — ACETAMINOPHEN 160 MG/5ML PO SOLN: 650.0000 mg | ORAL | Status: DC | PRN

## 2022-10-29 MED ORDER — ENOXAPARIN SODIUM 40 MG/0.4ML IJ SOSY
40.0000 mg | PREFILLED_SYRINGE | INTRAMUSCULAR | Status: DC
  Administered 2022-10-29: 40 mg via SUBCUTANEOUS
  Filled 2022-10-29: qty 0.4

## 2022-10-29 MED ORDER — CLOPIDOGREL BISULFATE 75 MG PO TABS
75.0000 mg | ORAL_TABLET | Freq: Every day | ORAL | Status: DC
  Administered 2022-10-30: 75 mg via ORAL
  Filled 2022-10-29: qty 1

## 2022-10-29 MED ORDER — STROKE: EARLY STAGES OF RECOVERY BOOK: Freq: Once | Status: AC

## 2022-10-29 MED ORDER — ACETAMINOPHEN 325 MG PO TABS: 650.0000 mg | ORAL_TABLET | ORAL | Status: DC | PRN

## 2022-10-29 MED ORDER — POTASSIUM CHLORIDE CRYS ER 20 MEQ PO TBCR
40.0000 meq | EXTENDED_RELEASE_TABLET | Freq: Once | ORAL | Status: AC
  Administered 2022-10-29: 40 meq via ORAL
  Filled 2022-10-29: qty 2

## 2022-10-29 MED ORDER — ASPIRIN 81 MG PO CHEW
81.0000 mg | CHEWABLE_TABLET | Freq: Every day | ORAL | Status: DC
  Administered 2022-10-30: 81 mg via ORAL
  Filled 2022-10-29: qty 1

## 2022-10-29 NOTE — ED Triage Notes (Signed)
Pt came from home [place] shopping got home at 11:00 Took a nap Woke up at 15:45 Went to walk to restroom  [place], LEFT sided weakness Called 911  EMS BGL 60 gave oral glucose Repeat BGL 90  EMS had LEFT sided weakness Slurred speech   Upon arrival in ED  NIHSS 0 BGL 68  Gave 1/2 amp D50 per MD

## 2022-10-29 NOTE — Progress Notes (Signed)
1630 call time 1631 beeper time 1704 began 1706 images sent to soc 1706 [facility] called 1707 exam completed in epic

## 2022-10-29 NOTE — Progress Notes (Signed)
Pt arrived to unit room 3, a/ox4, NIHSS 0, pt has no complaints at this time, pt oral temp 97.5, will continue to monitor.

## 2022-10-29 NOTE — Consult Note (Signed)
[facility] Telemedicine Consult   Requesting Provider: Valeria Batman Consult Participants: myself, [facility] nurse Misty, bedside nurse Tiffney Kitchens Location of the provider: [facility] Location of the patient: [name]   This consult was provided via telemedicine with 2-way video and audio communication. The patient/family was informed that care would be provided in this way and agreed to receive care in this manner.    Chief Complaint: Left sided weakness   HPI: Jenny Newton is a 57 y.o. with DM2, HTN, HLD, BMI 34.48, POAG,   Patient was in her usual state of health today she had gone out shopping without any problems.  At 11 AM she returned home and took a nap.  When she awoke at 3:45 PM she noticed her left side was weak and she fell going to the bathroom.  On EMS arrival they noted to left-sided weakness as well as blood glucose of 60 which improved to the 90s with administration of glucose.  Blood pressure with EMS was 135/64 with heart rate of 65.  However by ED arrival she was again with a blood glucose of 68 though her focal deficits had resolved.  She was evaluated by ED provider who ordered an amp of D50 which was being administered at the time of my joining the code stroke via video  She notes she was recently started on Mounjaro 4 weeks ago but denies any other recent changes in her medication.  She notes she has been having some red rashes on her lower extremities for the last 3 weeks which was intermittent initially but has been continuous for the past 1.5 weeks.  She denies any other recent health concerns  Patient does feel that her weakness was improving even prior to administration of glucose by EMS  LKW: 11 AM Thrombolytic given?: No, out of the window, symptoms resolved Delay to CT to give D50 and due to provider not aware CT not completed on arrival IR Thrombectomy? No, exam not c/w LVO Modified Rankin Scale: 0-Completely asymptomatic and back to baseline  post- stroke Time of teleneurologist evaluation: 16:42  Exam: Vitals were 142/72 heart rate of 65, O2 saturation 100% on room air  General: No acute distress, slow to respond at times Pulmonary: breathing comfortably Cardiac: regular rate and rhythm on monitor   NIH Stroke scale 1A: Level of Consciousness - 0 1B: Ask Month and Age - 0 1C: 'Blink Eyes' & 'Squeeze Hands' - 0 2: Test Horizontal Extraocular Movements - 0 3: Test Visual Fields - 0 4: Test Facial Palsy - 0 5A: Test Left Arm Motor Drift - 0 5B: Test Right Arm Motor Drift - 0 6A: Test Left Leg Motor Drift - 0 6B: Test Right Leg Motor Drift - 0 7: Test Limb Ataxia - 0 8: Test Sensation - 0 9: Test Language/Aphasia- 0 10: Test Dysarthria - 0 11: Test Extinction/Inattention - 0 NIHSS score: 0   Imaging Reviewed: 0  Labs reviewed in epic and pertinent values follow:  Basic Metabolic Panel: Recent Labs  Lab 10/29/22 1642  NA 140  K 3.1*  CL 108  CO2 22  GLUCOSE 76  BUN 31*  CREATININE 1.45*  CALCIUM 9.0    CBC: Recent Labs  Lab 10/29/22 1642  WBC 5.6  NEUTROABS 4.4  HGB 11.7*  HCT 35.3*  MCV 80.8  PLT 231    Coagulation Studies: Recent Labs    10/29/22 1642  LABPROT 14.6  INR 1.1       Assessment:  This is a 57 year old woman with past medical history of diabetes hypertension hyperlipidemia and obesity.  One possible etiology of her focal weakness is symptomatic hypoglycemia in the setting of starting on new diabetes medication.  However given she reported that her weakness was improving even prior to glucose administration, and her significant risk factors with ABCD2 score of 5 (1 point for elevated blood pressure, 1 point for diabetes history, 2 points for unilateral weakness, 1 point for symptoms lasting greater than 10 minutes), I do think it is prudent to treat this as a TIA  Recommendations:   # Bilateral lower extremity rashes - Eval by EDP / Primary team  # Right lacunar TIA  versus symptomatic hypoglycemia  - Stroke labs HgbA1c, fasting lipid panel - MRI brain  - CTA head and neck ordered by EDP completes vessel imaging - Frequent neuro checks per protocol - Echocardiogram - Prophylactic therapy-Antiplatelet med: Aspirin - dose 325mg  PO or 300mg  PR, followed by 81 mg daily - Please order Plavix 300 mg load with 75 mg daily, course to be determined pending vessel imaging contraindicated (defer to ED/primary team if there are any concerns about this with regards to her rash) - Risk factor modification - Telemetry monitoring; 30 day event monitor on discharge if no arrythmias captured  - Blood pressure goal   - Permissive hypertension to 220/120 due to possible acute stroke, may gradually normalize after 24-48 hours or once negative MRI brain obtained - PT consult, OT consult, Speech consult, not needed unless patient has recurrent symptoms develop - Dr. Melynda Ripple to follow in consultation   This patient is receiving care for possible acute neurological changes. There was 35 minutes of care by this provider at the time of service, including time for direct evaluation via telemedicine, review of medical records, imaging studies and discussion of findings with providers, the patient and/or family.   Brooke Dare MD-PhD [facility] [phone]   If 8pm-8am, please page neurology on call as listed in AMION.  CRITICAL CARE Performed by: Gordy Councilman   Total critical care time: 35 minutes  Critical care time was exclusive of separately billable procedures and treating other patients.  Critical care was necessary to treat or prevent imminent or life-threatening deterioration.  Critical care was time spent personally by me on the following activities: development of treatment plan with patient and/or surrogate as well as nursing, discussions with consultants, evaluation of patient's response to treatment, examination of patient, obtaining history  from patient or surrogate, ordering and performing treatments and interventions, ordering and review of laboratory studies, ordering and review of radiographic studies, pulse oximetry and re-evaluation of patient's condition.

## 2022-10-29 NOTE — ED Notes (Signed)
   10/29/22 1639  NIH Stroke Scale   Dizziness Present No  Headache Present No  Interval Initial  Level of Consciousness (1a.)    0  LOC Questions (1b. )    0  LOC Commands (1c. )    0  Best Gaze (2. )   0  Visual (3. )   0  Facial Palsy (4. )     0  Motor Arm, Left (5a. )    0  Motor Arm, Right (5b. )  0  Motor Leg, Left (6a. )   0  Motor Leg, Right (6b. )  0  Limb Ataxia (7. ) 0  Sensory (8. )   0  Best Language (9. )   0  Dysarthria (10. ) 0  Extinction/Inattention (11.)    0  Complete NIHSS TOTAL 0

## 2022-10-29 NOTE — Progress Notes (Signed)
Pt transported to ICU, stable condition, no signs of acute distress. Report given to ICU nurse.

## 2022-10-29 NOTE — ED Notes (Signed)
Dr Jarvis Newcomer at bedside. Oral temp 97.5- Dr Jarvis Newcomer aware.

## 2022-10-29 NOTE — Progress Notes (Signed)
Called ED to get report, secretary stated nurse is in room with another patient. Name and number left for nurse to return call.

## 2022-10-29 NOTE — ED Notes (Signed)
   10/29/22 1756  NIH Stroke Scale   Dizziness Present No  Headache Present No  Interval Shift assessment  Level of Consciousness (1a.)    0  LOC Questions (1b. )    0  LOC Commands (1c. )    0  Best Gaze (2. )   0  Visual (3. )   0  Facial Palsy (4. )     0  Motor Arm, Left (5a. )    0  Motor Arm, Right (5b. )  0  Motor Leg, Left (6a. )   0  Motor Leg, Right (6b. )  0  Limb Ataxia (7. ) 0  Sensory (8. )   0  Best Language (9. )   0  Dysarthria (10. ) 0  Extinction/Inattention (11.)    0  Complete NIHSS TOTAL 0

## 2022-10-29 NOTE — H&P (Signed)
History and Physical    Patient: Jenny Newton ZOX:[id number] DOB: July 23, 1965 DOA: 10/29/2022 DOS: the patient was seen and examined on 10/29/2022 PCP: Jenny Dapper, PA-C  Patient coming from: Home  Chief Complaint:  Chief Complaint  Patient presents with   Code Stroke   HPI: Jenny Newton is a 57 y.o. female with a history of T2DM with neuropathy, HTN, osteomyelitis s/p toe amputations 2022 who presented to the ED as a code stroke. She went grocery shopping today, came home ate a sandwich, took a nap all in her usual state of health. She woke up with slurred speech and left sided weakness causing a fall to the floor sustaining no major injuries, ultimately contacted EMS 20 minutes later. By their arrival, her weakness was improving but still noted. Glucose 60, given oral glucose and symptoms continued improving. Symptoms resolved by time of ED arrival. Glucose 68 on arrival to ED, VSS.   She's had intermittent red areas on her lower legs, mostly the right, some on left, spots grow to a couple inches diameter and resolve on their own, slightly tender when touched but not terribly bothersome, not itchy, nothing tried for them. Started about a month ago and the spots have become more constant in the past week. She was going to see her doctor about them tomorrow. No history of the same. No rashes or wounds anywhere else.   A comprehensive review of systems was conducted. Pertinent positives include +urinary urgency that is not necessarily new, some urge incontinence and symmetric leg swelling when she's standing at work all day. No dysuria, abd pain, N/V/D. She didn't notice any real symptoms after starting mounjaro 4 weeks ago, doesn't check CBGs at home. Knows she feels weak and very hungry when her blood sugar gets low usually, didn't feel that way today. No chest pain, dyspnea, cough, sick contacts, sore throat, neck pain or stiffness, headache.   CT head and CTA H/N was negative. Neurology  recommended TIA admission.  Review of Systems: As mentioned in the history of present illness. All other systems reviewed and are negative. Past Medical History:  Diagnosis Date   Anemia    Diabetes mellitus without complication (HCC)    Glaucoma    Hypertension    Osteomyelitis (HCC)    left 3rd toe   Past Surgical History:  Procedure Laterality Date   AMPUTATION TOE Left 06/20/2020   Procedure: Left 3rd toe amputation;  Surgeon: Toni Arthurs, MD;  Location: [facility];  Service: Orthopedics;  Laterality: Left;    AMPUTATION TOE Right 01/02/2021   Procedure: Right 3rd and 4th toe amputations at the middle phalanx;  Surgeon: Toni Arthurs, MD;  Location: [facility];  Service: Orthopedics;  Laterality: Right;   CATARACT EXTRACTION, BILATERAL     eue surgery  2018   EYE SURGERY  2019   left breast surgery  2009   Social History:  reports that she has never smoked. She has never used smokeless tobacco. She reports that she does not currently use alcohol. She reports that she does not currently use drugs.  No Known Allergies  Family History  Problem Relation Age of Onset   Hypertension Father    Diabetes Father     Prior to Admission medications   Medication Sig Start Date End Date Taking? Authorizing Provider  amLODipine (NORVASC) 10 MG tablet Take 10 mg by mouth daily. 11/17/19   [provider]  Ascorbic Acid (VITAMIN C) 1000  MG tablet Take 1,000 mg by mouth daily.    [provider]  Brinzolamide-Brimonidine [facility]) 1-0.2 % SUSP Apply to eye. 05/08/19   [provider]  ferrous sulfate 324 MG TBEC Take 324 mg by mouth.    [provider]  glyBURIDE (DIABETA) 5 MG tablet Take 5 mg by mouth 2 (two) times daily. 12/12/19   [provider]  losartan-hydrochlorothiazide (HYZAAR) 50-12.5 MG tablet Take 1 tablet by mouth daily. 11/17/19   [provider]  metFORMIN (GLUCOPHAGE) 1000 MG tablet  SMARTSIG:1 Tablet(s) By Mouth Morning-Evening 11/09/19   [provider]  Netarsudil-Latanoprost (ROCKLATAN) 0.02-0.005 % SOLN Apply to eye. 07/27/19   [provider]  rosuvastatin (CRESTOR) 10 MG tablet Take 10 mg by mouth daily.    [provider]    Physical Exam: Vitals:   10/29/22 1724 10/29/22 1730 10/29/22 1745 10/29/22 1800  BP:  129/61 (!) 114/55 (!) 144/88  Pulse:  64 60 71  Resp:  15 15 14   Temp: (!) 94.3 F (34.6 C)   (!) 97.5 F (36.4 C)  TempSrc: Rectal   Oral  SpO2:  99% 98% 100%  Weight:      Height:      Gen: Well-appearing female in no distress Pulm: Clear, nonlabored  CV: RRR, no MRG or edema GI: Soft, NT, ND, +BS  Neuro: Alert and oriented. No new focal deficits. Ext: Warm, dry Skin: Areas as pictured. There is slight palpable fullness beneath the larger areas, slightly tender, nonblanchable. No other areas when skin on trunk thighs and UEs examined.     Data Reviewed: SCr 1.45, K 3.1, BUN 31, LFTs wnl, WBC 5.6k normal diff, hgb 11.7 > 12.9, plt 231 INR 1.1, EtOH undetectable.  ECG NSR with nonspecific T wave abnormality, in areas appears to be artifact which skews QTc calculation, does not appear prolonged to me.   CT head: no stroke, CTA H/N: No LVO  Assessment and Plan: TIA: Concern based on transient symptoms for right sided CVA not evident on initial CT. Symptomatic hypoglycemia is possible but less likely as symptoms were improving prior to glucose administration. - TIA admission order set, brain MRI ordered (should be available 7/5) - Echo - ASA, plavix per neurology - PT/OT/SLP per protocol. Appears to have no dysarthria and taking pills fine, will let her eat to avoid further hypoglycemia.  - RF modification (HTN, DM), check A1c and lipid panel - Cardiac monitoring, no known AFib.   - Neuro checks per protocol: STAT CT +/- mannitol if decline in mental status, changing deficit   - On rosuvastatin, continue - Permissive  HTN  T2DM:  - HbA1c - Continue regular CBGs - Hold metformin, [facility], mounjaro  HTN:  - Hold home meds for now  Leg skin eruption: I am not completely clear on what this is. CBC ok and doesn't appear purpuric, nonblanchable, has mild underlying fullness but not really induration or fluctuance to suggest deep infection. Their spontaneous resolution without intervention is odd. Doubt it would respond to topical steroids.  - Check inflammatory markers ?manifestation of systemic syndrome.  - Could give therapeutic trial of abx, but she's stable right now so will await further work up.  Hypothermia: Not completely clear etiology here either. She appears quite well and has otherwise normal vital signs.  - Check UA since she's had some urgency - Check blood cultures, lactic acid - Monitor off abx for now - Not currently nor recently on steroids.   Obesity:  Body mass index is 36.95 kg/m. recently started [facility]   Advance Care Planning: Full code  Consults: Neurology  Family Communication: At bedside  Severity of Illness: The appropriate patient status for this patient is OBSERVATION. Observation status is judged to be reasonable and necessary in order to provide the required intensity of service to ensure the patient's safety. The patient's presenting symptoms, physical exam findings, and initial radiographic and laboratory data in the context of their medical condition is felt to place them at decreased risk for further clinical deterioration. Furthermore, it is anticipated that the patient will be medically stable for discharge from the hospital within 2 midnights of admission.   Author: Tyrone Nine, MD 10/29/2022 6:15 PM  For on call review [email or web address].

## 2022-10-29 NOTE — Progress Notes (Addendum)
Code stroke cart activated at 1630-EMS pre alert.  Dr Pearlean Brownie paged at [phone]. Pt arrived with EMS at 1639. 1642 Dr Iver Nestle on screen.  Pt to CT at 1700.  Ricci Barker, Tele Stroke RN

## 2022-10-29 NOTE — ED Provider Notes (Signed)
[place] EMERGENCY DEPARTMENT AT [facility] Provider Note   CSN: [id number] Arrival date & time: 10/29/22  1639  An emergency department physician performed an initial assessment on this suspected stroke patient at 1644.  History  Chief Complaint  Patient presents with   Code Stroke    Jenny Newton is a 57 y.o. female.  Patient states he went to bed around 11 AM and when she got up she was unable to move her left leg.  She also had weakness in the left arm.  Patient states this lasted for about 20 minutes.  Patient has a history of diabetes.  Paramedics arrived and stated her glucose was 68 and they gave her oral glucose but it did not raise her sugar.  Patient was also found to have a temperature 94.3 rectally  The history is provided by the patient and medical records. No language interpreter was used.  Weakness Severity:  Moderate Onset quality:  Sudden Timing:  Intermittent Progression:  Resolved Chronicity:  New Context: not alcohol use   Relieved by:  Nothing Worsened by:  Nothing Ineffective treatments:  None tried Associated symptoms: no abdominal pain, no chest pain, no cough, no diarrhea, no frequency, no headaches and no seizures        Home Medications Prior to Admission medications   Medication Sig Start Date End Date Taking? Authorizing Provider  amLODipine (NORVASC) 10 MG tablet Take 10 mg by mouth daily. 11/17/19   [provider]  Ascorbic Acid (VITAMIN C) 1000 MG tablet Take 1,000 mg by mouth daily.    [provider]  Brinzolamide-Brimonidine [facility]) 1-0.2 % SUSP Apply to eye. 05/08/19   [provider]  ferrous sulfate 324 MG TBEC Take 324 mg by mouth.    [provider]  glyBURIDE (DIABETA) 5 MG tablet Take 5 mg by mouth 2 (two) times daily. 12/12/19   [provider]  losartan-hydrochlorothiazide (HYZAAR) 50-12.5 MG tablet Take 1 tablet by mouth daily. 11/17/19   [provider]   metFORMIN (GLUCOPHAGE) 1000 MG tablet SMARTSIG:1 Tablet(s) By Mouth Morning-Evening 11/09/19   [provider]  Netarsudil-Latanoprost (ROCKLATAN) 0.02-0.005 % SOLN Apply to eye. 07/27/19   [provider]  rosuvastatin (CRESTOR) 10 MG tablet Take 10 mg by mouth daily.    [provider]      Allergies    Patient has no known allergies.    Review of Systems   Review of Systems  Constitutional:  Negative for appetite change and fatigue.  HENT:  Negative for congestion, ear discharge and sinus pressure.   Eyes:  Negative for discharge.  Respiratory:  Negative for cough.   Cardiovascular:  Negative for chest pain.  Gastrointestinal:  Negative for abdominal pain and diarrhea.  Genitourinary:  Negative for frequency and hematuria.  Musculoskeletal:  Negative for back pain.  Skin:  Negative for rash.  Neurological:  Positive for weakness. Negative for seizures and headaches.  Psychiatric/Behavioral:  Negative for hallucinations.     Physical Exam Updated Vital Signs BP 110/68   Pulse 66   Temp (!) 94.3 F (34.6 C) (Rectal)   Resp 18   Ht 5\' 8"  (1.727 m)   Wt 110.2 kg   SpO2 100%   BMI 36.95 kg/m  Physical Exam Vitals and nursing note reviewed.  Constitutional:      Appearance: She is well-developed.  HENT:     Head: Normocephalic.     Nose: Nose normal.  Eyes:  General: No scleral icterus.    Conjunctiva/sclera: Conjunctivae normal.  Neck:     Thyroid: No thyromegaly.  Cardiovascular:     Rate and Rhythm: Normal rate and regular rhythm.     Heart sounds: No murmur heard.    No friction rub. No gallop.  Pulmonary:     Breath sounds: No stridor. No wheezing or rales.  Chest:     Chest wall: No tenderness.  Abdominal:     General: There is no distension.     Tenderness: There is no abdominal tenderness. There is no rebound.  Musculoskeletal:        General: Normal range of motion.     Cervical back: Neck supple.  Lymphadenopathy:      Cervical: No cervical adenopathy.  Skin:    Findings: Rash present. No erythema.     Comments: Both lower extremity have areas with rash of unknown cause.  Neurological:     Mental Status: She is alert and oriented to person, place, and time.     Motor: No abnormal muscle tone.     Coordination: Coordination normal.  Psychiatric:        Behavior: Behavior normal.     ED Results / Procedures / Treatments   Labs (all labs ordered are listed, but only abnormal results are displayed) Labs Reviewed  CBC - Abnormal; Notable for the following components:      Result Value   Hemoglobin 11.7 (*)    HCT 35.3 (*)    All other components within normal limits  DIFFERENTIAL - Abnormal; Notable for the following components:   Lymphs Abs 0.6 (*)    All other components within normal limits  COMPREHENSIVE METABOLIC PANEL - Abnormal; Notable for the following components:   Potassium 3.1 (*)    BUN 31 (*)    Creatinine, Ser 1.45 (*)    GFR, Estimated 42 (*)    All other components within normal limits  CBG MONITORING, ED - Abnormal; Notable for the following components:   Glucose-Capillary 68 (*)    All other components within normal limits  I-STAT CHEM 8, ED - Abnormal; Notable for the following components:   Potassium 3.1 (*)    BUN 29 (*)    Creatinine, Ser 1.50 (*)    All other components within normal limits  CBG MONITORING, ED - Abnormal; Notable for the following components:   Glucose-Capillary 117 (*)    All other components within normal limits  ETHANOL  PROTIME-INR  APTT  RAPID URINE DRUG SCREEN, HOSP PERFORMED  URINALYSIS, ROUTINE W REFLEX MICROSCOPIC    EKG None  Radiology No results found.  Procedures Procedures    Medications Ordered in ED Medications  iohexol (OMNIPAQUE) 350 MG/ML injection 75 mL (75 mLs Intravenous Contrast Given 10/29/22 1707)  dextrose 50 % solution 25 mL (25 mLs Intravenous Given 10/29/22 1644)    ED Course/ Medical Decision Making/ A&P    CRITICAL CARE Performed by: Bethann Berkshire Total critical care time: 40 minutes Critical care time was exclusive of separately billable procedures and treating other patients. Critical care was necessary to treat or prevent imminent or life-threatening deterioration. Critical care was time spent personally by me on the following activities: development of treatment plan with patient and/or surrogate as well as nursing, discussions with consultants, evaluation of patient's response to treatment, examination of patient, obtaining history from patient or surrogate, ordering and performing treatments and interventions, ordering and review of laboratory studies, ordering and review of radiographic studies,  pulse oximetry and re-evaluation of patient's condition.      {This is the note from Dr. Iver Nestle, neurologist Assessment: This is a 57 year old woman with past medical history of diabetes hypertension hyperlipidemia and obesity.  One possible etiology of her focal weakness is symptomatic hypoglycemia in the setting of starting on new diabetes medication.  However given she reported that her weakness was improving even prior to glucose administration, and her significant risk factors with ABCD2 score of 5 (1 point for elevated blood pressure, 1 point for diabetes history, 2 points for unilateral weakness, 1 point for symptoms lasting greater than 10 minutes), I do think it is prudent to treat this as a TIA  Recommendations:   # Bilateral lower extremity rashes - Eval by EDP / Primary team  # Right lacunar TIA versus symptomatic hypoglycemia  - Stroke labs HgbA1c, fasting lipid panel - MRI brain  - CTA head and neck ordered by EDP completes vessel imaging - Frequent neuro checks per protocol - Echocardiogram - Prophylactic therapy-Antiplatelet med: Aspirin - dose 325mg  PO or 300mg  PR, followed by 81 mg daily - Please order Plavix 300 mg load with 75 mg daily, course to be determined pending  vessel imaging contraindicated (defer to ED/primary team if there are any concerns about this with regards to her rash) - Risk factor modification - Telemetry monitoring; 30 day event monitor on discharge if no arrythmias captured  - Blood pressure goal   - Permissive hypertension to 220/120 due to possible acute stroke, may gradually normalize after 24-48 hours or once negative MRI brain obtained - PT consult, OT consult, Speech consult, not needed unless patient has recurrent symptoms develop - Dr. Melynda Ripple to follow in consultation                            Medical Decision Making Amount and/or Complexity of Data Reviewed Labs: ordered. Radiology: ordered.  Risk OTC drugs. Prescription drug management. Decision regarding hospitalization.  This patient presents to the ED for concern of weakness, this involves an extensive number of treatment options, and is a complaint that carries with it a high risk of complications and morbidity.  The differential diagnosis includes stroke, metabolic encephalopathy   Co morbidities that complicate the patient evaluation  Diabetes hypertension   Additional history obtained:  Additional history obtained from patient External records from outside source obtained and reviewed including hospital records   Lab Tests:  I Ordered, and personally interpreted labs.  The pertinent results include: White blood count 5.6   Imaging Studies ordered:  I ordered imaging studies including CT head I independently visualized and interpreted imaging which showed no acute disease I agree with the radiologist interpretation   Cardiac Monitoring: / EKG:  The patient was maintained on a cardiac monitor.  I personally viewed and interpreted the cardiac monitored which showed an underlying rhythm of: Normal sinus rhythm   Consultations Obtained:  I requested consultation with the neurology and hospitalist,  and discussed lab and imaging findings as well  as pertinent plan - they recommend: Admit to medicine with neurology consult   Problem List / ED Course / Critical interventions / Medication management  Hypertension diabetes and stroke I ordered medication including aspirin Reevaluation of the patient after these medicines showed that the patient stayed the same I have reviewed the patients home medicines and have made adjustments as needed   Social Determinants of Health:  None   Test /  Admission - Considered:  None   Patient with TIA symptoms.  Also some hypothermia.  Patient will be admitted to medicine with neurology consult tomorrow.  We will follow the neurologist recommendations by Dr.Bhagat today   Final Clinical Impression(s) / ED Diagnoses Final diagnoses:  None    Rx / DC Orders ED Discharge Orders     None         Bethann Berkshire, MD 10/30/22 1502

## 2022-10-29 NOTE — ED Notes (Signed)
Attempted to get oral temp several time Temp 93.9-94.0 Rectal temp 94.104F Warmer placed on pt

## 2022-10-30 ENCOUNTER — Observation Stay (HOSPITAL_BASED_OUTPATIENT_CLINIC_OR_DEPARTMENT_OTHER): Payer: Managed Care, Other (non HMO)

## 2022-10-30 ENCOUNTER — Observation Stay (HOSPITAL_COMMUNITY): Payer: Managed Care, Other (non HMO)

## 2022-10-30 DIAGNOSIS — G459 Transient cerebral ischemic attack, unspecified: Secondary | ICD-10-CM | POA: Diagnosis not present

## 2022-10-30 LAB — ECHOCARDIOGRAM COMPLETE
Area-P 1/2: 2.56 cm2
Height: 68 in
MV M vel: 1.35 m/s
MV Peak grad: 7.3 mmHg
S' Lateral: 2.55 cm
Weight: 3537.94 oz

## 2022-10-30 LAB — HEMOGLOBIN A1C
Hgb A1c MFr Bld: 7.5 % — ABNORMAL HIGH (ref 4.8–5.6)
Mean Plasma Glucose: 168.55 mg/dL

## 2022-10-30 LAB — URINALYSIS, ROUTINE W REFLEX MICROSCOPIC
Bacteria, UA: NONE SEEN
Bilirubin Urine: NEGATIVE
Glucose, UA: NEGATIVE mg/dL
Ketones, ur: NEGATIVE mg/dL
Leukocytes,Ua: NEGATIVE
Nitrite: NEGATIVE
Protein, ur: NEGATIVE mg/dL
Specific Gravity, Urine: 1.023 (ref 1.005–1.030)
pH: 5 (ref 5.0–8.0)

## 2022-10-30 LAB — RAPID URINE DRUG SCREEN, HOSP PERFORMED
Amphetamines: NOT DETECTED
Barbiturates: NOT DETECTED
Benzodiazepines: NOT DETECTED
Cocaine: NOT DETECTED
Opiates: NOT DETECTED
Tetrahydrocannabinol: NOT DETECTED

## 2022-10-30 LAB — LIPID PANEL
Cholesterol: 96 mg/dL (ref 0–200)
HDL: 42 mg/dL (ref >40)
LDL Cholesterol: 43 mg/dL (ref 0–99)
Total CHOL/HDL Ratio: 2.3 RATIO
Triglycerides: 54 mg/dL (ref <150)
VLDL: 11 mg/dL (ref 0–40)

## 2022-10-30 LAB — C-REACTIVE PROTEIN: CRP: 5.7 mg/dL — ABNORMAL HIGH (ref <1.0)

## 2022-10-30 LAB — GLUCOSE, CAPILLARY
Glucose-Capillary: 50 mg/dL — ABNORMAL LOW (ref 70–99)
Glucose-Capillary: 118 mg/dL — ABNORMAL HIGH (ref 70–99)

## 2022-10-30 LAB — HIV ANTIBODY (ROUTINE TESTING W REFLEX): HIV Screen 4th Generation wRfx: NONREACTIVE

## 2022-10-30 MED ORDER — ASPIRIN 81 MG PO CHEW: 81.0000 mg | CHEWABLE_TABLET | Freq: Every day | ORAL | 2 refills | Status: AC

## 2022-10-30 NOTE — Evaluation (Signed)
Physical Therapy Evaluation Patient Details Name: Jenny Newton MRN: [id number] DOB: 12-24-1965 Today's Date: 10/30/2022  History of Present Illness  NATURELLE NEVIUS is a 57 y.o. female with a history of T2DM with neuropathy, HTN, osteomyelitis s/p toe amputations 2022 who presented to the ED as a code stroke. She went grocery shopping today, came home ate a sandwich, took a nap all in her usual state of health. She woke up with slurred speech and left sided weakness causing a fall to the floor sustaining no major injuries, ultimately contacted EMS 20 minutes later. By their arrival, her weakness was improving but still noted. Glucose 60, given oral glucose and symptoms continued improving. Symptoms resolved by time of ED arrival. Glucose 68 on arrival to ED, VSS.      She's had intermittent red areas on her lower legs, mostly the right, some on left, spots grow to a couple inches diameter and resolve on their own, slightly tender when touched but not terribly bothersome, not itchy, nothing tried for them. Started about a month ago and the spots have become more constant in the past week. She was going to see her doctor about them tomorrow. No history of the same. No rashes or wounds anywhere else.   Clinical Impression  Patient functioning near baseline for functional mobility and gait demonstrating good return for bed mobility, transfers and ambulating in room/hallway without loss of balance.  Plan:  Patient discharged from physical therapy to care of nursing for ambulation daily as tolerated for length of stay.          Assistance Recommended at Discharge PRN  If plan is discharge home, recommend the following:  Can travel by private vehicle  Other (comment) (patient near baseline)        Equipment Recommendations None recommended by PT  Recommendations for Other Services       Functional Status Assessment Patient has not had a recent decline in their functional status     Precautions /  Restrictions Precautions Precautions: None Restrictions Weight Bearing Restrictions: No      Mobility  Bed Mobility Overal bed mobility: Modified Independent                  Transfers Overall transfer level: Modified independent                      Ambulation/Gait Ambulation/Gait assistance: Modified independent (Device/Increase time) Gait Distance (Feet): 100 Feet   Gait Pattern/deviations: Decreased step length - right, Decreased step length - left, Decreased stride length, Decreased dorsiflexion - right, Decreased dorsiflexion - left, Steppage Gait velocity: decreased     General Gait Details: slightly labored cadence without loss of balance, mild steppage gait due to decreased bilateral ankle dorsiflexion which is baseline per patient  Stairs            Wheelchair Mobility     Tilt Bed    Modified Rankin (Stroke Patients Only)       Balance Overall balance assessment: No apparent balance deficits (not formally assessed)                                           Pertinent Vitals/Pain Pain Assessment Pain Assessment: No/denies pain    Home Living Family/patient expects to be discharged to:: Private residence Living Arrangements: Spouse/significant other Available Help at Discharge: Family;Available 24 hours/day  Type of Home: House Home Access: Stairs to enter Entrance Stairs-Rails: Right;Left;Can reach both Entrance Stairs-Number of Steps: 4   Home Layout: One level Home Equipment: None      Prior Function Prior Level of Function : Independent/Modified Independent;Driving             Mobility Comments: Community ambulator without AD, drives ADLs Comments: Independent     Hand Dominance        Extremity/Trunk Assessment   Upper Extremity Assessment Upper Extremity Assessment: Defer to OT evaluation    Lower Extremity Assessment Lower Extremity Assessment: Overall WFL for tasks assessed     Cervical / Trunk Assessment Cervical / Trunk Assessment: Normal  Communication      Cognition Arousal/Alertness: Awake/alert Behavior During Therapy: WFL for tasks assessed/performed Overall Cognitive Status: Within Functional Limits for tasks assessed                                          General Comments      Exercises     Assessment/Plan    PT Assessment Patient does not need any further PT services  PT Problem List         PT Treatment Interventions      PT Goals (Current goals can be found in the Care Plan section)  Acute Rehab PT Goals Patient Stated Goal: return home with family to assist PT Goal Formulation: With patient Time For Goal Achievement: 10/30/22 Potential to Achieve Goals: Good    Frequency       Co-evaluation               AM-PAC PT "6 Clicks" Mobility  Outcome Measure Help needed turning from your back to your side while in a flat bed without using bedrails?: None Help needed moving from lying on your back to sitting on the side of a flat bed without using bedrails?: None Help needed moving to and from a bed to a chair (including a wheelchair)?: None Help needed standing up from a chair using your arms (e.g., wheelchair or bedside chair)?: None Help needed to walk in hospital room?: None Help needed climbing 3-5 steps with a railing? : A Little 6 Click Score: 23    End of Session   Activity Tolerance: Patient tolerated treatment well Patient left: in chair;with call bell/phone within reach Nurse Communication: Mobility status PT Visit Diagnosis: Unsteadiness on feet (R26.81);Other abnormalities of gait and mobility (R26.89);Muscle weakness (generalized) (M62.81)    Time: 1610-9604 PT Time Calculation (min) (ACUTE ONLY): 23 min   Charges:   PT Evaluation $PT Eval Moderate Complexity: 1 Mod PT Treatments $Therapeutic Activity: 23-37 mins PT General Charges $$ ACUTE PT VISIT: 1 Visit         12:18  PM, 10/30/22 Ocie Bob, MPT Physical Therapist with [facility] [phone] office [phone] mobile phone

## 2022-10-30 NOTE — Progress Notes (Signed)
OT Cancellation Note  Patient Details Name: MARYIAH ROWEKAMP MRN: [id number] DOB: 1965-09-08   Cancelled Treatment:    Reason Eval/Treat Not Completed: Patient at procedure or test/ unavailable. OT attempted to see patient x2 this morning.  8am pt off floor 850am echo in room OT will follow up as schedule allows.    Trish Mage, OTR/L [facility] [phone] [place] 10/30/2022, 9:44 AM

## 2022-10-30 NOTE — Discharge Summary (Signed)
Physician Discharge Summary   Patient: Jenny Newton MRN: [id number] DOB: Mar 04, 1966  Admit date:     10/29/2022  Discharge date: 10/30/22  Discharge Physician: Tyrone Nine   PCP: Shawnie Dapper, PA-C   Recommendations at discharge:  Follow up with neurology after discharge for TIA.  Follow up with PCP soon to discuss hypoglycemia and new mounjaro (told to hold until follow up), consider glucose monitoring. Follow up with PCP for continued work up of idiopathic lower extremity lesions/purpura. consider autoimmune evaluation if symptoms persist.  Discharge Diagnoses: Principal Problem:   TIA (transient ischemic attack) Active Problems:   T2DM (type 2 diabetes mellitus) (HCC)   HTN (hypertension)   Rash  Hospital Course: Jenny Newton is a 57 y.o. female with a history of T2DM with neuropathy, HTN, osteomyelitis s/p toe amputations 2022 who presented to the ED as a code stroke. She went grocery shopping today, came home ate a sandwich, took a nap all in her usual state of health. She woke up with slurred speech and left sided weakness causing a fall to the floor sustaining no major injuries, ultimately contacted EMS 20 minutes later. By their arrival, her weakness was improving but still noted. Glucose 60, given oral glucose and symptoms continued improving. Symptoms resolved by time of ED arrival. Glucose 68 on arrival to ED, VSS.    She's had intermittent red areas on her lower legs, mostly the right, some on left, spots grow to a couple inches diameter and resolve on their own, slightly tender when touched but not terribly bothersome, not itchy, nothing tried for them. Started about a month ago and the spots have become more constant in the past week. She was going to see her doctor about them tomorrow. No history of the same. No rashes or wounds anywhere else.    A comprehensive review of systems was conducted. Pertinent positives include +urinary urgency that is not necessarily new, some  urge incontinence and symmetric leg swelling when she's standing at work all day. No joint swelling/pain. No dysuria, abd pain, N/V/D. She didn't notice any real symptoms after starting mounjaro 4 weeks ago, doesn't check CBGs at home. Knows she feels weak and very hungry when her blood sugar gets low usually, didn't feel that way today. No chest pain, dyspnea, cough, sick contacts, sore throat, neck pain or stiffness, headache.   She was treated as code stroke, though symptoms resolved, CT and subsequent MRI brain showed no acute abnormalities, no LVO on CTA head/neck. Neurology has recommended discharge on aspirin 81mg , continue statin, and referral for outpatient neurology follow up.   Assessment and Plan: TIA: Concern based on transient symptoms for right sided CVA not evident on initial CT, MRI brain also negative. Symptomatic hypoglycemia is possible but less likely as symptoms were improving prior to glucose administration. Echo essentially normal without CES. - Starting aspirin 81mg  daily per neurology. Symptoms have resolved. - No AFib on telemetry monitoring. - Continue statin, BP treatment, DM treatment as below.   T2DM: HbA1c 7.5%.  - Continue PCP follow up. She had CBG 60 on EMS arrival in setting of having started mounjaro 4 weeks prior. Recommend she check CBGs regularly (was not doing this) and discussing care with her prescribing physician prior to restarting mounjaro.    Suspected neuropathy:  - Neurology clinic follow up   HTN:  - Continue home Tx.  HLD:  - HDL 42, LDL 43 on rosuvastatin! Continue this.   Leg skin eruption/purpura:  -  CBC unremarkable, including normal platelets. No mucosal lesions or systemic symptoms/signs at this time. No arthropathy. UA without RBCs or protein, HIV NR. Continue vitamin C supplement.  - ESR mildly elevated at 30, CRP 5.7, though no systemic evidence of infection/inflammation. CRP pending. Would recommend autoimmune profile, but defer  this to PCP with whom the patient will follow up.     Hypothermia: Not completely clear etiology here either. She appears quite well and has otherwise normal vital signs. This has resolved spontaneously, is not associated with current or recent steroids or hypotension. No localized infection found on work up, lactic acid normal x2.  - Will monitor blood cultures x5 days (NGTD at discharge) - Monitor off abx for now since she appears so well    Aortic root dilatation: 41mm by echo, incidental.  - Suggest surveillance.   Obesity: Body mass index is 36.95 kg/m. recently started [employer]  Consultants: Neurology Procedures performed: None  Disposition: Home Diet recommendation:  Cardiac and Carb modified diet DISCHARGE MEDICATION: Allergies as of 10/30/2022   No Known Allergies      Medication List     TAKE these medications    amLODipine 10 MG tablet Commonly known as: NORVASC Take 10 mg by mouth daily.   aspirin 81 MG chewable tablet Chew 1 tablet (81 mg total) by mouth daily. Start taking on: October 31, 2022   ferrous sulfate 324 MG Tbec Take 324 mg by mouth.   glyBURIDE 5 MG tablet Commonly known as: DIABETA Take 5 mg by mouth 2 (two) times daily.   losartan-hydrochlorothiazide 50-12.5 MG tablet Commonly known as: HYZAAR Take 1 tablet by mouth daily.   metFORMIN 1000 MG tablet Commonly known as: GLUCOPHAGE SMARTSIG:1 Tablet(s) By Mouth Morning-Evening   Rocklatan 0.02-0.005 % Soln Generic drug: Netarsudil-Latanoprost Apply to eye.   rosuvastatin 10 MG tablet Commonly known as: CRESTOR Take 10 mg by mouth daily.   Simbrinza 1-0.2 % Susp Generic drug: Brinzolamide-Brimonidine Apply to eye.   vitamin C 1000 MG tablet Take 1,000 mg by mouth daily.        Follow-up Information     Shawnie Dapper, PA-C. Schedule an appointment as soon as possible for a visit in 1 week(s).   Specialties: Physician Assistant, Internal Medicine Contact information: [address] [place] [address] [phone]                Discharge Exam: Ceasar Mons Weights   10/29/22 1723 10/29/22 2016  Weight: 110.2 kg 100.3 kg  BP (!) 123/47   Pulse (!) 56   Temp 99.6 F (37.6 C) (Oral)   Resp 13   Ht 5\' 8"  (1.727 m)   Wt 100.3 kg   SpO2 97%   BMI 33.62 kg/m   Gen: Well-appearing female in no distress HEENT: No mucosal lesions, no iritis/ocular inflammation Pulm: Clear, nonlabored  CV: RRR, no MRG or edema GI: Soft, NT, ND, +BS  Neuro: Alert and oriented. Pleasant, interactive, no deficits. Ext: No arthritis/synovitis Skin: Areas as pictured. There is slight palpable fullness beneath the larger areas, not actually tender, but "sensitive," nonblanchable. No other areas when skin on trunk thighs and UEs examined.     Condition at discharge: stable  The results of significant diagnostics from this hospitalization (including imaging, microbiology, ancillary and laboratory) are listed below for reference.   Imaging Studies: ECHOCARDIOGRAM COMPLETE  Result Date: 10/30/2022    ECHOCARDIOGRAM REPORT   Patient Name:   CHRITINA OSIER Date of Exam:  10/30/2022 Medical Rec #:  [id number]    Height:       68.0 in Accession #:    [id number]   Weight:       221.1 lb Date of Birth:  11-Dec-1965    BSA:          2.133 m Patient Age:    56 years     BP:           123/47 mmHg Patient Gender: F            HR:           57 bpm. [name] Procedure: 2D Echo, Cardiac Doppler and Color Doppler Indications:    Stroke I63.9  History:        Patient has no prior history of Echocardiogram examinations.                 TIA; Risk Factors:Diabetes, Hypertension and Non-Smoker.  Sonographer:    Aron Baba Referring Phys: [id number] Tyrone Nine  Sonographer Comments: Image acquisition challenging due to respiratory motion. IMPRESSIONS  1. Left ventricular ejection fraction, by estimation, is 65 to 70%. The left ventricle has normal function. The left ventricle has  no regional wall motion abnormalities. Left ventricular diastolic parameters were normal.  2. Right ventricular systolic function is normal. The right ventricular size is normal. There is normal pulmonary artery systolic pressure. The estimated right ventricular systolic pressure is 20.3 mmHg.  3. Left atrial size was mildly dilated.  4. The mitral valve is grossly normal. Trivial mitral valve regurgitation.  5. The aortic valve is tricuspid. Aortic valve regurgitation is trivial.  6. Aortic dilatation noted. There is mild dilatation of the ascending aorta, measuring 41 mm.  7. The inferior vena cava is normal in size with greater than 50% respiratory variability, suggesting right atrial pressure of 3 mmHg. Comparison(s): No prior Echocardiogram. FINDINGS  Left Ventricle: Left ventricular ejection fraction, by estimation, is 65 to 70%. The left ventricle has normal function. The left ventricle has no regional wall motion abnormalities. The left ventricular internal cavity size was normal in size. There is  no left ventricular hypertrophy. Left ventricular diastolic parameters were normal. Right Ventricle: The right ventricular size is normal. No increase in right ventricular wall thickness. Right ventricular systolic function is normal. There is normal pulmonary artery systolic pressure. The tricuspid regurgitant velocity is 2.08 m/s, and  with an assumed right atrial pressure of 3 mmHg, the estimated right ventricular systolic pressure is 20.3 mmHg. Left Atrium: Left atrial size was mildly dilated. Right Atrium: Right atrial size was normal in size. Pericardium: Trivial pericardial effusion is present. The pericardial effusion is circumferential. Mitral Valve: The mitral valve is grossly normal. Trivial mitral valve regurgitation. Tricuspid Valve: The tricuspid valve is grossly normal. Tricuspid valve regurgitation is trivial. Aortic Valve: The aortic valve is tricuspid. Aortic valve regurgitation is trivial.  Pulmonic Valve: The pulmonic valve was grossly normal. Pulmonic valve regurgitation is trivial. Aorta: Aortic dilatation noted and the aortic root is normal in size and structure. There is mild dilatation of the ascending aorta, measuring 41 mm. Venous: The inferior vena cava is normal in size with greater than 50% respiratory variability, suggesting right atrial pressure of 3 mmHg. IAS/Shunts: No atrial level shunt detected by color flow Doppler.  LEFT VENTRICLE PLAX 2D LVIDd:         4.55 cm   Diastology LVIDs:         2.55 cm  LV e' medial:    8.85 cm/s LV PW:         1.00 cm   LV E/e' medial:  9.8 LV IVS:        0.80 cm   LV e' lateral:   11.30 cm/s LVOT diam:     2.10 cm   LV E/e' lateral: 7.6 LV SV:         76 LV SV Index:   36 LVOT Area:     3.46 cm  RIGHT VENTRICLE RV S prime:     19.00 cm/s TAPSE (M-mode): 1.7 cm LEFT ATRIUM           Index        RIGHT ATRIUM           Index LA diam:      3.20 cm 1.50 cm/m   RA Area:     14.20 cm LA Vol (A2C): 44.9 ml 21.05 ml/m  RA Volume:   30.40 ml  14.25 ml/m LA Vol (A4C): 76.9 ml 36.06 ml/m  AORTIC VALVE LVOT Vmax:   97.80 cm/s LVOT Vmean:  63.300 cm/s LVOT VTI:    0.220 m  AORTA Ao Root diam: 3.70 cm Ao Asc diam:  4.10 cm MITRAL VALVE               TRICUSPID VALVE MV Area (PHT): 2.56 cm    TR Peak grad:   17.3 mmHg MV Decel Time: 296 msec    TR Vmax:        208.00 cm/s MR Peak grad: 7.3 mmHg MR Vmax:      135.00 cm/s  SHUNTS MV E velocity: 86.40 cm/s  Systemic VTI:  0.22 m MV A velocity: 87.50 cm/s  Systemic Diam: 2.10 cm MV E/A ratio:  0.99 Nona Dell MD Electronically signed by Nona Dell MD Signature Date/Time: 10/30/2022/10:29:13 AM    Final    MR BRAIN WO CONTRAST  Result Date: 10/30/2022 CLINICAL DATA:  Provided history: Neuro deficit, acute, stroke suspected. EXAM: MRI HEAD WITHOUT CONTRAST TECHNIQUE: Multiplanar, multiecho pulse sequences of the brain and surrounding structures were obtained without intravenous contrast. COMPARISON:   Non-contrast head CT and CT angiogram head/neck 10/29/2022. FINDINGS: Brain: Cerebral volume is normal. No cortical encephalomalacia is identified. No significant cerebral white matter disease. There is no acute infarct. No evidence of an intracranial mass. No chronic intracranial blood products. No extra-axial fluid collection. No midline shift. Vascular: Maintained flow voids within the proximal large arterial vessels. Skull and upper cervical spine: No focal suspicious marrow lesion. Sinuses/Orbits: No mass or acute finding within the imaged orbits. Prior bilateral ocular lens replacement. No significant paranasal sinus disease. IMPRESSION: Unremarkable non-contrast MRI appearance of the brain. No evidence of an acute intracranial abnormality. Electronically Signed   By: Jackey Loge D.O.   On: 10/30/2022 08:52   CT HEAD CODE STROKE WO CONTRAST  Result Date: 10/29/2022 CLINICAL DATA:  Code stroke.  Left-sided weakness. EXAM: CT ANGIOGRAPHY HEAD AND NECK TECHNIQUE: Multidetector CT imaging of the head and neck was performed using the standard protocol during bolus administration of intravenous contrast. Multiplanar CT image reconstructions and MIPs were obtained to evaluate the vascular anatomy. Carotid stenosis measurements (when applicable) are obtained utilizing NASCET criteria, using the distal internal carotid diameter as the denominator. RADIATION DOSE REDUCTION: This exam was performed according to the departmental dose-optimization program which includes automated exposure control, adjustment of the mA and/or kV according to patient size and/or use of iterative reconstruction technique.  CONTRAST:  75mL OMNIPAQUE IOHEXOL 350 MG/ML SOLN COMPARISON:  None Available. FINDINGS: CT HEAD FINDINGS Brain: There is no acute intracranial hemorrhage, extra-axial fluid collection, or acute infarct Parenchymal volume is normal. The ventricles are normal in size. Gray-white differentiation is preserved The pituitary  and suprasellar region are normal. There is no mass lesion. There is no mass effect or midline shift. No hyperdense vessel is seen. Vascular: No hyperdense vessel or unexpected calcification. Skull: Normal. Negative for fracture or focal lesion. Sinuses/Orbits: The paranasal sinuses are clear. Bilateral lens implants are in place. The globes and orbits are otherwise unremarkable. Other: The mastoid air cells and middle ear cavities are clear. ASPECTS [facility] Stroke Program Early CT Score) - Ganglionic level infarction (caudate, lentiform nuclei, internal capsule, insula, M1-M3 cortex): 7 - Supraganglionic infarction (M4-M6 cortex): 3 Total score (0-10 with 10 being normal): 10 CTA NECK FINDINGS Aortic arch: The imaged aortic arch is normal. The origins of the major branch vessels are patent. The subclavian arteries are patent to the level imaged. Right carotid system: The right common, internal, and external carotid arteries are patent, with minimal plaque at the bifurcation but no hemodynamically significant stenosis or occlusion. There is no evidence of dissection or aneurysm. Left carotid system: The left common, internal, and external carotid arteries are patent, with minimal plaque of the bifurcation but no hemodynamically significant stenosis or occlusion. There is no evidence of dissection or aneurysm. Vertebral arteries: There is mild stenosis of the origin of the right vertebral artery and moderate stenosis of the proximal left V1 segment. Otherwise, the vertebral arteries are patent, without other significant stenosis or occlusion there is no evidence of dissection or aneurysm. Skeleton: There is no acute osseous abnormality or suspicious osseous lesion. There is no visible canal hematoma. Other neck: The soft tissues of the neck are unremarkable. Upper chest: The imaged lung apices are clear. Review of the MIP images confirms the above findings CTA HEAD FINDINGS Anterior circulation: There is mild  calcified plaque in the intracranial ICAs without significant stenosis or occlusion. The bilateral MCAs and ACAS are patent, without proximal stenosis or occlusion. The anterior communicating artery is not definitely seen. There is no aneurysm or AVM. Posterior circulation: The bilateral V4 segments are patent. The basilar artery is patent. The major cerebellar arteries appear patent. The bilateral PCAs are patent, without proximal stenosis or occlusion. There is a fetal origin of both PCAs. There is no aneurysm or AVM. Venous sinuses: Patent. Anatomic variants: None. Review of the MIP images confirms the above findings IMPRESSION: 1. No acute intracranial pathology on initial noncontrast head CT. 2. No emergent large vessel occlusion. 3. Mild calcified plaque at the carotid bifurcations and intracranial ICAs without significant stenosis or occlusion. Moderate left and mild right vertebral artery origin stenosis. These results were called by telephone at the time of interpretation on 10/29/2022 at 5:15 pm to provider [facility] ZAMMIT , who verbally acknowledged these results. Electronically Signed   By: Lesia Hausen M.D.   On: 10/29/2022 17:38   CT ANGIO HEAD NECK W WO CM  Result Date: 10/29/2022 CLINICAL DATA:  Code stroke.  Left-sided weakness. EXAM: CT ANGIOGRAPHY HEAD AND NECK TECHNIQUE: Multidetector CT imaging of the head and neck was performed using the standard protocol during bolus administration of intravenous contrast. Multiplanar CT image reconstructions and MIPs were obtained to evaluate the vascular anatomy. Carotid stenosis measurements (when applicable) are obtained utilizing NASCET criteria, using the distal internal carotid diameter as the denominator. RADIATION DOSE  REDUCTION: This exam was performed according to the departmental dose-optimization program which includes automated exposure control, adjustment of the mA and/or kV according to patient size and/or use of iterative reconstruction  technique. CONTRAST:  75mL OMNIPAQUE IOHEXOL 350 MG/ML SOLN COMPARISON:  None Available. FINDINGS: CT HEAD FINDINGS Brain: There is no acute intracranial hemorrhage, extra-axial fluid collection, or acute infarct Parenchymal volume is normal. The ventricles are normal in size. Gray-white differentiation is preserved The pituitary and suprasellar region are normal. There is no mass lesion. There is no mass effect or midline shift. No hyperdense vessel is seen. Vascular: No hyperdense vessel or unexpected calcification. Skull: Normal. Negative for fracture or focal lesion. Sinuses/Orbits: The paranasal sinuses are clear. Bilateral lens implants are in place. The globes and orbits are otherwise unremarkable. Other: The mastoid air cells and middle ear cavities are clear. ASPECTS [facility] Stroke Program Early CT Score) - Ganglionic level infarction (caudate, lentiform nuclei, internal capsule, insula, M1-M3 cortex): 7 - Supraganglionic infarction (M4-M6 cortex): 3 Total score (0-10 with 10 being normal): 10 CTA NECK FINDINGS Aortic arch: The imaged aortic arch is normal. The origins of the major branch vessels are patent. The subclavian arteries are patent to the level imaged. Right carotid system: The right common, internal, and external carotid arteries are patent, with minimal plaque at the bifurcation but no hemodynamically significant stenosis or occlusion. There is no evidence of dissection or aneurysm. Left carotid system: The left common, internal, and external carotid arteries are patent, with minimal plaque of the bifurcation but no hemodynamically significant stenosis or occlusion. There is no evidence of dissection or aneurysm. Vertebral arteries: There is mild stenosis of the origin of the right vertebral artery and moderate stenosis of the proximal left V1 segment. Otherwise, the vertebral arteries are patent, without other significant stenosis or occlusion there is no evidence of dissection or aneurysm.  Skeleton: There is no acute osseous abnormality or suspicious osseous lesion. There is no visible canal hematoma. Other neck: The soft tissues of the neck are unremarkable. Upper chest: The imaged lung apices are clear. Review of the MIP images confirms the above findings CTA HEAD FINDINGS Anterior circulation: There is mild calcified plaque in the intracranial ICAs without significant stenosis or occlusion. The bilateral MCAs and ACAS are patent, without proximal stenosis or occlusion. The anterior communicating artery is not definitely seen. There is no aneurysm or AVM. Posterior circulation: The bilateral V4 segments are patent. The basilar artery is patent. The major cerebellar arteries appear patent. The bilateral PCAs are patent, without proximal stenosis or occlusion. There is a fetal origin of both PCAs. There is no aneurysm or AVM. Venous sinuses: Patent. Anatomic variants: None. Review of the MIP images confirms the above findings IMPRESSION: 1. No acute intracranial pathology on initial noncontrast head CT. 2. No emergent large vessel occlusion. 3. Mild calcified plaque at the carotid bifurcations and intracranial ICAs without significant stenosis or occlusion. Moderate left and mild right vertebral artery origin stenosis. These results were called by telephone at the time of interpretation on 10/29/2022 at 5:15 pm to provider [facility] ZAMMIT , who verbally acknowledged these results. Electronically Signed   By: Lesia Hausen M.D.   On: 10/29/2022 17:38    Microbiology: Results for orders placed or performed during the hospital encounter of 10/29/22  Blood culture (routine x 2)     Status: None (Preliminary result)   Collection Time: 10/29/22  6:27 PM   Specimen: BLOOD RIGHT ARM  Result Value Ref Range Status  Specimen Description   Final    BLOOD RIGHT ARM BOTTLES DRAWN AEROBIC AND ANAEROBIC   Special Requests Blood Culture adequate volume  Final   Culture   Final    NO GROWTH < 24  HOURS Performed at [facility], [address], [place], [place] [address]    Report Status PENDING  Incomplete  Blood culture (routine x 2)     Status: None (Preliminary result)   Collection Time: 10/29/22  6:27 PM   Specimen: BLOOD RIGHT HAND  Result Value Ref Range Status   Specimen Description   Final    BLOOD RIGHT HAND BOTTLES DRAWN AEROBIC AND ANAEROBIC   Special Requests Blood Culture adequate volume  Final   Culture   Final    NO GROWTH < 24 HOURS Performed at [facility], [address] [place], [place] [address]    Report Status PENDING  Incomplete  MRSA Next Gen by PCR, Nasal     Status: None   Collection Time: 10/29/22  8:06 PM   Specimen: Nasal Mucosa; Nasal Swab  Result Value Ref Range Status   MRSA by PCR Next Gen NOT DETECTED NOT DETECTED Final    Comment: (NOTE) The GeneXpert MRSA Assay (FDA approved for NASAL specimens only), is one component of a comprehensive MRSA colonization surveillance program. It is not intended to diagnose MRSA infection nor to guide or monitor treatment for MRSA infections. Test performance is not FDA approved in patients less than 16 years old. Performed at [facility], [address] [place], [place] [address]     Labs: CBC: Recent Labs  Lab 10/29/22 1642 10/29/22 1701  WBC 5.6  --   NEUTROABS 4.4  --   HGB 11.7* 12.9  HCT 35.3* 38.0  MCV 80.8  --   PLT 231  --    Basic Metabolic Panel: Recent Labs  Lab 10/29/22 1642 10/29/22 1701  NA 140 143  K 3.1* 3.1*  CL 108 110  CO2 22  --   GLUCOSE 76 70  BUN 31* 29*  CREATININE 1.45* 1.50*  CALCIUM 9.0  --    Liver Function Tests: Recent Labs  Lab 10/29/22 1642  AST 30  ALT 27  ALKPHOS 57  BILITOT 0.6  PROT 8.0  ALBUMIN 3.6   CBG: Recent Labs  Lab 10/29/22 1641 10/29/22 1715 10/29/22 2017 10/30/22 0731  GLUCAP 68* 117* 108* 50*    Discharge time spent: greater than 30 minutes.  Signed: Tyrone Nine, MD [facility] 10/30/2022

## 2022-10-30 NOTE — Progress Notes (Signed)
   10/30/22 1102  Spiritual Encounters  Type of Visit Initial  Care provided to: Pt and family  Referral source Chaplain team  Reason for visit Advance directives  OnCall Visit No   Chaplain went to visit patient and provide education for filling out the Advance Care Directives form. Patient was in the room with two family members, which were also part of the conversation. Chaplain went through the form, providing explanation on how to fill the form. Patient and family members understood and had no questions. Patient and family members remained filling out the form. Chaplain asked patient to call the Chaplain's office to finalize with the form signature and notarization.Chaplain remains available when needed to continue with this process.

## 2022-10-30 NOTE — Progress Notes (Signed)
SLP Cancellation Note  Patient Details Name: Jenny Newton MRN: [id number] DOB: [birth year]   Cancelled treatment:       Reason Eval/Treat Not Completed: SLP screened, MRI reveals "No evidence of an acute intracranial abnormality". Pt's speech, language and cognition are functioning at baseline. Pt reports all symptoms have resolved. No further ST needs noted at this time; our service will sign off, thank you for this referral,  Shalea Tomczak H. Romie Levee, CCC-SLP Speech Language Pathologist    Georgetta Haber 10/30/2022, 10:04 AM

## 2022-10-30 NOTE — TOC CM/SW Note (Signed)
Transition of Care [facility]) - Inpatient Brief Assessment   Patient Details  Name: Jenny Newton MRN: [id number] Date of Birth: 05-Jan-1966  Transition of Care [facility]) CM/SW Contact:    Karn Cassis, LCSW Phone Number: 10/30/2022, 12:06 PM   Clinical Narrative: Pt reports no needs at this time. PT evaluation completed and no follow up needed. Pt works full time. D/C today.     Transition of Care Asessment: Insurance and Status: Insurance coverage has been reviewed Patient has primary care physician: Yes Home environment has been reviewed: Lives with husband. Prior level of function:: Independent. Prior/Current Home Services: No current home services Social Determinants of Health Reivew: SDOH reviewed no interventions necessary Readmission risk has been reviewed: Yes Transition of care needs: no transition of care needs at this time

## 2022-10-30 NOTE — Progress Notes (Signed)
  Echocardiogram 2D Echocardiogram has been performed.  Jenny Newton 10/30/2022, 10:05 AM

## 2022-10-30 NOTE — Progress Notes (Signed)
I connected with  Jenny Newton on 10/30/22 by a video enabled telemedicine application and verified that I am speaking with the correct person using two identifiers.   I discussed the limitations of evaluation and management by telemedicine. The patient expressed understanding and agreed to proceed.  Location of the provider: [facility] Location of the patient: [facility] hospital   Subjective: No further episodes of weakness overnight.  States she occasionally has some tingling in her arms (both left and right, around the elbow and below) as well as both legs (around the knees and below).   ROS: negative except above Examination  Vital signs in last 24 hours: Temp:  [94.3 F (34.6 C)-99.6 F (37.6 C)] 99.6 F (37.6 C) (07/05 0407) Pulse Rate:  [52-74] 56 (07/05 0700) Resp:  [6-22] 13 (07/05 0700) BP: (106-156)/(42-88) 123/47 (07/05 0700) SpO2:  [97 %-100 %] 97 % (07/05 0700) Weight:  [100.3 kg-110.2 kg] 100.3 kg (07/04 2016)  General: Sitting in bed, not in apparent distress Neuro: MS: Alert, oriented, follows commands CN: pupils equal and reactive,  EOMI, face symmetric, tongue midline, normal sensation over face, Motor: 5/5 strength in all 4 extremities Coordination: normal Gait: not tested  Basic Metabolic Panel: Recent Labs  Lab 10/29/22 1642 10/29/22 1701  NA 140 143  K 3.1* 3.1*  CL 108 110  CO2 22  --   GLUCOSE 76 70  BUN 31* 29*  CREATININE 1.45* 1.50*  CALCIUM 9.0  --     CBC: Recent Labs  Lab 10/29/22 1642 10/29/22 1701  WBC 5.6  --   NEUTROABS 4.4  --   HGB 11.7* 12.9  HCT 35.3* 38.0  MCV 80.8  --   PLT 231  --      Coagulation Studies: Recent Labs    10/29/22 1642  LABPROT 14.6  INR 1.1    Imaging  CTH wo contrast 10/29/2022: No acute intracranial pathology on initial noncontrast head CT.   CTA head and neck w and wo contrast 10/29/2022: No emergent large vessel occlusion. Mild calcified plaque at the carotid bifurcations and  intracranial ICAs without significant stenosis or occlusion. Moderate left and mild right vertebral artery origin stenosis.  MR Brain wo contrast 10/30/2022: Unremarkable non-contrast MRI appearance of the brain. No evidence of an acute intracranial abnormality.  ASSESSMENT AND PLAN: 57 year old female with history of hypertension, diabetes, hyperlipidemia, obesity who presented with transient left-sided weakness in the setting of blood glucose 60 which quickly resolved.  Of note patient was started on Mounjaro 4 weeks ago.  Transient left hemiparesis Symptomatic hypoglycemia Diabetes -Most likely TIA or transient weakness in the setting of hyperglycemia -Given ABCD2 score of 5, would recommend aspirin 81 mg daily -Continue current dose of rosuvastatin -Stroke education including BEFAST  Transient paresthesias -Most likely peripheral neuropathy -Will schedule follow-up in neurology clinic, may need EMG/nerve conduction studies -Discussed plan with patient as well as Dr. Jarvis Newcomer via secure chat  I have spent a total of  36  minutes with the patient reviewing hospital notes,  test results, labs and examining the patient as well as establishing an assessment and plan that was discussed personally with the patient.  > 50% of time was spent in direct patient care.        Lindie Spruce Epilepsy [facility] For questions after 5pm please refer to AMION to reach the Neurologist on call

## 2022-10-31 LAB — CULTURE, BLOOD (ROUTINE X 2)
Culture: NO GROWTH
Special Requests: ADEQUATE

## 2022-11-01 LAB — CULTURE, BLOOD (ROUTINE X 2)

## 2022-11-02 LAB — CULTURE, BLOOD (ROUTINE X 2)

## 2022-11-03 LAB — CULTURE, BLOOD (ROUTINE X 2): Special Requests: ADEQUATE

## 2022-12-04 ENCOUNTER — Encounter: Payer: Self-pay | Admitting: Podiatry

## 2022-12-04 ENCOUNTER — Ambulatory Visit (INDEPENDENT_AMBULATORY_CARE_PROVIDER_SITE_OTHER): Payer: Managed Care, Other (non HMO) | Admitting: Podiatry

## 2022-12-04 DIAGNOSIS — B070 Plantar wart: Secondary | ICD-10-CM | POA: Diagnosis not present

## 2022-12-06 NOTE — Progress Notes (Signed)
Subjective:   Patient ID: Jenny Newton, female   DOB: 57 y.o.   MRN: [id number]   HPI Patient states on the outside of the right foot on the bottom it has been very sore and there is a lesion there that is been present for several months and is hard to walk.  States that she has tried to trim on it herself without relief.  Patient does not smoke likes to be active   Review of Systems  All other systems reviewed and are negative.       Objective:  Physical Exam Vitals and nursing note reviewed.  Constitutional:      Appearance: She is well-developed.  Pulmonary:     Effort: Pulmonary effort is normal.  Musculoskeletal:        General: Normal range of motion.  Skin:    General: Skin is warm.  Neurological:     Mental Status: She is alert.     Neurovascular status intact muscle strength adequate range of motion adequate exquisite discomfort noted on the plantar lateral aspect of the right foot that is very tender when pressed making walking difficult upon debridement pinpoint bleeding is noted     Assessment:  Verruca plantaris plantar right with pain     Plan:  H&P reviewed I went ahead today I debrided the lesion fully I then went ahead and I applied chemical agent to create immune response with sterile dressing I instructed what to do if any blistering were to occur and patient will be seen back to recheck in approximately 4 weeks

## 2023-01-01 ENCOUNTER — Encounter: Payer: Self-pay | Admitting: Podiatry

## 2023-01-01 ENCOUNTER — Ambulatory Visit (INDEPENDENT_AMBULATORY_CARE_PROVIDER_SITE_OTHER): Payer: Managed Care, Other (non HMO) | Admitting: Podiatry

## 2023-01-01 DIAGNOSIS — B070 Plantar wart: Secondary | ICD-10-CM | POA: Diagnosis not present

## 2023-01-03 NOTE — Progress Notes (Signed)
Subjective:   Patient ID: Jenny Newton, female   DOB: 57 y.o.   MRN: [id number]   HPI Patient states she seems improved with a lesion still present outside the right foot but better than it was   ROS      Objective:  Physical Exam  Neurovascular status intact with keratotic lesion outside right midfoot painful when pressed but improved and thinner than it was     Assessment:  Verruca plantaris lateral side right foot with improvement     Plan:  Sharp sterile debridement of the lesion applied chemical agent to create immune response with sterile dressing explained what to do if blistering were to occur and reappoint as symptoms indicate

## 2023-01-08 ENCOUNTER — Ambulatory Visit (INDEPENDENT_AMBULATORY_CARE_PROVIDER_SITE_OTHER): Payer: Managed Care, Other (non HMO) | Admitting: Neurology

## 2023-01-08 ENCOUNTER — Encounter: Payer: Self-pay | Admitting: Neurology

## 2023-01-08 VITALS — BP 132/68 | HR 100 | Ht 68.0 in | Wt 215.5 lb

## 2023-01-08 DIAGNOSIS — G459 Transient cerebral ischemic attack, unspecified: Secondary | ICD-10-CM | POA: Diagnosis not present

## 2023-01-08 NOTE — Patient Instructions (Signed)
Continue current medications Continue to follow-up with your doctors Return as needed or any other concerns.

## 2023-01-08 NOTE — Progress Notes (Signed)
[facility]  PATIENT: Jenny Newton DOB: [birth year]  REQUESTING CLINICIAN: Charlsie Quest, MD HISTORY FROM: Patient/Chart review  REASON FOR VISIT: Left side weakness/TIA    HISTORICAL  CHIEF COMPLAINT:  Chief Complaint  Patient presents with   Hospitalization Follow-up    Rm13, alone, NP internal referral for TIA: pt stated that she feels great like she is gaining strength back     HISTORY OF PRESENT ILLNESS:  This is a 57 year old woman with vascular risk factors including hypertension, hyperlipidemia, diabetes who is presenting after being seen in the hospital for left-sided weakness in July.  Patient reports waking up in the middle of the night to ise the bathroom, did not feel like herself, felt weak trying to get to the bathroom.  She did call her husband, who came and assessed her and told her that her left face was drooping.  While husband was on the phone with EMS, patient got up to go to the bathroom and fell.  When EMS arrived, initial vital signs showed a blood sugar of 60, blood pressure 135/64, patient was given dextrose.  Patient remembered EMS telling her that she was slurring her speech.  She was taken to the hospital, initial stroke workup including CT head, CT angiogram and MRI brain were negative for any acute stroke.  Her symptoms resolved the next day.  She was diagnosed with TIA, and started on aspirin.  Since being home she has not had any additional event.  She is compliant with her medications and doing well.   Of note patient report 4 weeks prior to the event she started on [facility] for her diabetes.  Initially she did have worsening of her peripheral neuropathy but currently it is has improved.  No other complaints.    OTHER MEDICAL CONDITIONS: Hypertension, Hyperlipidemia, Diabetes    REVIEW OF SYSTEMS: Full 14 system review of systems performed and negative with exception of: As noted in the HPI   ALLERGIES: No Known  Allergies  HOME MEDICATIONS: Outpatient Medications Prior to Visit  Medication Sig Dispense Refill   amLODipine (NORVASC) 10 MG tablet Take 10 mg by mouth daily.     aspirin 81 MG chewable tablet Chew 1 tablet (81 mg total) by mouth daily. 30 tablet 2   Brinzolamide-Brimonidine (SIMBRINZA) 1-0.2 % SUSP Apply to eye.     losartan-hydrochlorothiazide (HYZAAR) 50-12.5 MG tablet Take 1 tablet by mouth daily.     metFORMIN (GLUCOPHAGE) 1000 MG tablet Take 1,000 mg by mouth 2 (two) times daily with a meal.     Netarsudil-Latanoprost (ROCKLATAN) 0.02-0.005 % SOLN Apply to eye.     rosuvastatin (CRESTOR) 10 MG tablet Take 10 mg by mouth daily.     timolol (TIMOPTIC) 0.5 % ophthalmic solution 1 drop 2 (two) times daily.     Vitamin D, Ergocalciferol, (DRISDOL) 1.25 MG (50000 UNIT) CAPS capsule Take by mouth.     glyBURIDE (DIABETA) 5 MG tablet Take 5 mg by mouth daily with breakfast.     No facility-administered medications prior to visit.    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    PAST SURGICAL HISTORY: Past Surgical History:  Procedure Laterality Date   AMPUTATION TOE Left 06/20/2020   Procedure: Left 3rd toe amputation;  Surgeon: Toni Arthurs, MD;  Location: [facility]  [place];  Service: Orthopedics;  Laterality: Left;    AMPUTATION TOE Right 01/02/2021   Procedure: Right 3rd and 4th toe amputations at the middle phalanx;  Surgeon: Toni Arthurs, MD;  Location: [facility];  Service: Orthopedics;  Laterality: Right;   CATARACT EXTRACTION, BILATERAL     eue surgery  2018   EYE SURGERY  2019   left breast surgery  2009    FAMILY HISTORY: Family History  Problem Relation Age of Onset   Hypertension Father    Diabetes Father     SOCIAL HISTORY: Social History   Socioeconomic History   Marital status:  Married    Spouse name: jerry   Number of children: 0   Years of education: Not on file   Highest education level: Bachelor's degree (e.g., BA, AB, BS)  Occupational History   Not on file  Tobacco Use   Smoking status: Never   Smokeless tobacco: Never  Vaping Use   Vaping status: Never Used  Substance and Sexual Activity   Alcohol use: Not Currently   Drug use: Not Currently   Sexual activity: Yes    Birth control/protection: None, Post-menopausal  Other Topics Concern   Not on file  Social History Narrative   Not on file   Social Determinants of Health   Financial Resource Strain: Low Risk  (12/18/2019)   Overall Financial Resource Strain (CARDIA)    Difficulty of Paying Living Expenses: Not hard at all  Food Insecurity: No Food Insecurity (10/29/2022)   Hunger Vital Sign    Worried About Running Out of Food in the Last Year: Never true    Ran Out of Food in the Last Year: Never true  Transportation Needs: No Transportation Needs (10/29/2022)   PRAPARE - Administrator, Civil Service (Medical): No    Lack of Transportation (Non-Medical): No  Physical Activity: Inactive (12/18/2019)   Exercise Vital Sign    Days of Exercise per Week: 0 days    Minutes of Exercise per Session: 0 min  Stress: No Stress Concern Present (12/18/2019)   [employer] of Occupational Health - Occupational Stress Questionnaire    Feeling of Stress : Not at all  Social Connections: Moderately Integrated (12/18/2019)   Social Connection and Isolation Panel [NHANES]    Frequency of Communication with Friends and Family: Twice a week    Frequency of Social Gatherings with Friends and Family: Once a week    Attends Religious Services: 1 to 4 times per year    Active Member of [employer] or Organizations: No    Attends Banker Meetings: Never    Marital Status: Married  Catering manager Violence: Not At Risk (10/29/2022)   Humiliation, Afraid, Rape, and Kick questionnaire    Fear of  Current or Ex-Partner: No    Emotionally Abused: No    Physically Abused: No    Sexually Abused: No    PHYSICAL EXAM  GENERAL EXAM/CONSTITUTIONAL: Vitals:  Vitals:   01/08/23 0823  BP: 132/68  Pulse: 100  Weight: 215 lb 8 oz (97.8 kg)  Height: 5\' 8"  (1.727 m)   Body mass index is 32.77 kg/m. Wt Readings from Last 3 Encounters:  01/08/23 215 lb 8 oz (97.8 kg)  10/29/22 221 lb 1.9 oz (100.3 kg)  01/02/21 236 lb 1.8 oz (107.1 kg)   Patient is in no distress; well developed, nourished and groomed; neck is supple  MUSCULOSKELETAL: Gait, strength, tone, movements noted in Neurologic  exam below  NEUROLOGIC: MENTAL STATUS:      No data to display         awake, alert, oriented to person, place and time recent and remote memory intact normal attention and concentration language fluent, comprehension intact, naming intact fund of knowledge appropriate  CRANIAL NERVE:  2nd, 3rd, 4th, 6th - Visual fields full to confrontation, extraocular muscles intact, no nystagmus 5th - facial sensation symmetric 7th - facial strength symmetric 8th - hearing intact 9th - palate elevates symmetrically, uvula midline 11th - shoulder shrug symmetric 12th - tongue protrusion midline  MOTOR:  normal bulk and tone, full strength in the BUE, BLE  SENSORY:  normal and symmetric to light touch  COORDINATION:  finger-nose-finger, fine finger movements normal  REFLEXES:  deep tendon reflexes present and symmetric  GAIT/STATION:  normal   DIAGNOSTIC DATA (LABS, IMAGING, TESTING) - I reviewed patient records, labs, notes, testing and imaging myself where available.  Lab Results  Component Value Date   WBC 5.6 10/29/2022   HGB 12.9 10/29/2022   HCT 38.0 10/29/2022   MCV 80.8 10/29/2022   PLT 231 10/29/2022      Component Value Date/Time   NA 143 10/29/2022 1701   K 3.1 (L) 10/29/2022 1701   CL 110 10/29/2022 1701   CO2 22 10/29/2022 1642   GLUCOSE 70 10/29/2022 1701    BUN 29 (H) 10/29/2022 1701   CREATININE 1.50 (H) 10/29/2022 1701   CALCIUM 9.0 10/29/2022 1642   PROT 8.0 10/29/2022 1642   ALBUMIN 3.6 10/29/2022 1642   AST 30 10/29/2022 1642   ALT 27 10/29/2022 1642   ALKPHOS 57 10/29/2022 1642   BILITOT 0.6 10/29/2022 1642   GFRNONAA 42 (L) 10/29/2022 1642   GFRAA  07/28/2010 2222    >60        The eGFR has been calculated using the MDRD equation. This calculation has not been validated in all clinical situations. eGFR's persistently <60 mL/min signify possible Chronic Kidney Disease.   Lab Results  Component Value Date   CHOL 96 10/30/2022   HDL 42 10/30/2022   LDLCALC 43 10/30/2022   TRIG 54 10/30/2022   CHOLHDL 2.3 10/30/2022   Lab Results  Component Value Date   HGBA1C 7.5 (H) 10/29/2022   No results found for: "VITAMINB12" Lab Results  Component Value Date   TSH 0.809 Test methodology is 3rd generation TSH 11/28/2008    MRI Brain 10/30/2022 Unremarkable non-contrast MRI appearance of the brain. No evidence of an acute intracranial abnormality.  CTA head and neck 10/29/2022 1. No acute intracranial pathology on initial noncontrast head CT. 2. No emergent large vessel occlusion. 3. Mild calcified plaque at the carotid bifurcations and intracranial ICAs without significant stenosis or occlusion. Moderate left and mild right vertebral artery origin stenosis.    ASSESSMENT AND PLAN  57 y.o. year old female with vascular risk factor including hypertension, hyperlipidemia, diabetes who is presenting after being seen in the hospital for left-sided weakness associated with hypoglycemia.  Stroke workup negative, she was diagnosed with TIA and started on aspirin.  She is compliant with all of medication.  Advised patient to continue medication, continue to follow with her PCP and return as needed or any other concerns.  She was understanding.   1. TIA (transient ischemic attack)     Patient Instructions  Continue current  medications Continue to follow-up with your doctors Return as needed or any other concerns.  No orders of the defined types were  placed in this encounter.   No orders of the defined types were placed in this encounter.   Return if symptoms worsen or fail to improve.    Windell Norfolk, MD 01/08/2023, 9:09 AM  [facility] [address] [place], [place] [address] [phone]

## 2023-02-19 LAB — LIPID PANEL
LDL Cholesterol: 63
Triglycerides: 110 (ref 40–160)

## 2023-02-19 LAB — TSH: TSH: 1.99 (ref 0.41–5.90)

## 2023-02-19 LAB — BASIC METABOLIC PANEL
BUN: 26 — AB (ref 4–21)
Creatinine: 1.3 — AB (ref 0.5–1.1)
Glucose: 255

## 2023-02-19 LAB — COMPREHENSIVE METABOLIC PANEL: eGFR: 47

## 2023-02-19 LAB — MICROALBUMIN / CREATININE URINE RATIO: Microalb Creat Ratio: 15

## 2023-03-16 NOTE — Patient Instructions (Signed)

## 2023-03-18 ENCOUNTER — Encounter: Payer: Self-pay | Admitting: Nurse Practitioner

## 2023-03-18 ENCOUNTER — Ambulatory Visit (INDEPENDENT_AMBULATORY_CARE_PROVIDER_SITE_OTHER): Payer: Managed Care, Other (non HMO) | Admitting: Nurse Practitioner

## 2023-03-18 VITALS — BP 126/52 | HR 45 | Ht 68.0 in | Wt 208.4 lb

## 2023-03-18 DIAGNOSIS — E1165 Type 2 diabetes mellitus with hyperglycemia: Secondary | ICD-10-CM

## 2023-03-18 DIAGNOSIS — Z7984 Long term (current) use of oral hypoglycemic drugs: Secondary | ICD-10-CM

## 2023-03-18 DIAGNOSIS — E782 Mixed hyperlipidemia: Secondary | ICD-10-CM | POA: Diagnosis not present

## 2023-03-18 DIAGNOSIS — I10 Essential (primary) hypertension: Secondary | ICD-10-CM

## 2023-03-18 LAB — POCT GLYCOSYLATED HEMOGLOBIN (HGB A1C): Hemoglobin A1C: 10.1 % — AB (ref 4.0–5.6)

## 2023-03-18 MED ORDER — ACCU-CHEK SOFTCLIX LANCETS MISC: 12 refills | Status: DC

## 2023-03-18 MED ORDER — ACCU-CHEK GUIDE TEST VI STRP: ORAL_STRIP | 12 refills | Status: DC

## 2023-03-18 MED ORDER — TRULICITY 1.5 MG/0.5ML ~~LOC~~ SOAJ: 1.5000 mg | SUBCUTANEOUS | 0 refills | Status: DC

## 2023-03-18 MED ORDER — TRULICITY 0.75 MG/0.5ML ~~LOC~~ SOAJ: 0.7500 mg | SUBCUTANEOUS | 0 refills | Status: DC

## 2023-03-18 NOTE — Progress Notes (Signed)
Endocrinology Consult Note       03/18/2023, 3:34 PM   Subjective:    Patient ID: Jenny Newton, female    DOB: [birth year].  DAVANNA CLUKEY is being seen in consultation for management of currently uncontrolled symptomatic diabetes requested by  Assunta Found, MD.   Past Medical History:  Diagnosis Date   Anemia    Diabetes mellitus without complication (HCC)    Glaucoma    Hypertension    Osteomyelitis (HCC)    left 3rd toe    Past Surgical History:  Procedure Laterality Date   AMPUTATION TOE Left 06/20/2020   Procedure: Left 3rd toe amputation;  Surgeon: Toni Arthurs, MD;  Location: [facility];  Service: Orthopedics;  Laterality: Left;    AMPUTATION TOE Right 01/02/2021   Procedure: Right 3rd and 4th toe amputations at the middle phalanx;  Surgeon: Toni Arthurs, MD;  Location: [facility];  Service: Orthopedics;  Laterality: Right;   CATARACT EXTRACTION, BILATERAL     eue surgery  2018   EYE SURGERY  2019   left breast surgery  2009    Social History   Socioeconomic History   Marital status: Married    Spouse name: jerry   Number of children: 0   Years of education: Not on file   Highest education level: Bachelor's degree (e.g., BA, AB, BS)  Occupational History   Not on file  Tobacco Use   Smoking status: Never   Smokeless tobacco: Never  Vaping Use   Vaping status: Never Used  Substance and Sexual Activity   Alcohol use: Not Currently   Drug use: Not Currently   Sexual activity: Yes    Birth control/protection: None, Post-menopausal  Other Topics Concern   Not on file  Social History Narrative   Not on file   Social Determinants of Health   Financial Resource Strain: Low Risk  (12/18/2019)   Overall Financial Resource Strain (CARDIA)    Difficulty of Paying Living Expenses: Not hard at all  Food Insecurity: No Food Insecurity (10/29/2022)   Hunger Vital  Sign    Worried About Running Out of Food in the Last Year: Never true    Ran Out of Food in the Last Year: Never true  Transportation Needs: No Transportation Needs (10/29/2022)   PRAPARE - Administrator, Civil Service (Medical): No    Lack of Transportation (Non-Medical): No  Physical Activity: Inactive (12/18/2019)   Exercise Vital Sign    Days of Exercise per Week: 0 days    Minutes of Exercise per Session: 0 min  Stress: No Stress Concern Present (12/18/2019)   [employer] of Occupational Health - Occupational Stress Questionnaire    Feeling of Stress : Not at all  Social Connections: Moderately Integrated (12/18/2019)   Social Connection and Isolation Panel [NHANES]    Frequency of Communication with Friends and Family: Twice a week    Frequency of Social Gatherings with Friends and Family: Once a week    Attends Religious Services: 1 to 4 times per year    Active Member of [employer] or Organizations: No    Attends Banker Meetings:  Never    Marital Status: Married    Family History  Problem Relation Age of Onset   Hypertension Father    Diabetes Father     Outpatient Encounter Medications as of 03/18/2023  Medication Sig   Accu-Chek Softclix Lancets lancets Use as instructed to monitor glucose once daily   amLODipine (NORVASC) 10 MG tablet Take 10 mg by mouth daily.   aspirin 81 MG chewable tablet Chew 1 tablet (81 mg total) by mouth daily.   Brinzolamide-Brimonidine (SIMBRINZA) 1-0.2 % SUSP Apply to eye.   Cholecalciferol (VITAMIN D3) 50 MCG (2000 UT) capsule Take 2,000 Units by mouth daily.   Dulaglutide (TRULICITY) 0.75 MG/0.5ML SOAJ Inject 0.75 mg into the skin once a week.   [START ON 04/15/2023] Dulaglutide (TRULICITY) 1.5 MG/0.5ML SOAJ Inject 1.5 mg into the skin once a week.   glucose blood (ACCU-CHEK GUIDE TEST) test strip Use as instructed to monitor glucose once daily   losartan-hydrochlorothiazide (HYZAAR) 50-12.5 MG tablet Take 1  tablet by mouth daily.   metFORMIN (GLUCOPHAGE) 1000 MG tablet Take 1,000 mg by mouth 2 (two) times daily with a meal.   Netarsudil-Latanoprost (ROCKLATAN) 0.02-0.005 % SOLN Apply to eye.   rosuvastatin (CRESTOR) 10 MG tablet Take 10 mg by mouth daily.   timolol (TIMOPTIC) 0.5 % ophthalmic solution 1 drop 2 (two) times daily.   Vitamin D, Ergocalciferol, (DRISDOL) 1.25 MG (50000 UNIT) CAPS capsule Take by mouth. (Patient not taking: Reported on 03/18/2023)   No facility-administered encounter medications on file as of 03/18/2023.    ALLERGIES: No Known Allergies  VACCINATION STATUS: Immunization History  Administered Date(s) Administered   Janssen (J&J) SARS-COV-2 Vaccination 09/30/2019    Diabetes She presents for her initial diabetic visit. She has type 2 diabetes mellitus. Onset time: diagnosed at approx age of 89. Her disease course has been fluctuating. There are no hypoglycemic associated symptoms. Associated symptoms include fatigue, polydipsia and polyuria. There are no hypoglycemic complications. Symptoms are stable. 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 (Multiple toe amputations from osteomyelitis) Risk factors for coronary artery disease include diabetes mellitus, family history, hypertension and sedentary lifestyle. Current diabetic treatment includes oral agent (monotherapy). She is compliant with treatment most of the time. Her weight is decreasing steadily. She is following a generally unhealthy diet. When asked about meal planning, she reported none. She has not had a previous visit with a dietitian. She participates in exercise intermittently. (She presents today for her consultation with no meter or logs to review, she does not have a meter at home.  Her POCT A1c today is 10.1%, increasing from last visit of 7.5%.  She drinks water, sweet tea, koolaid, soda (although she has cut back), and flavored ICE waters.  She eats on average 2  meals per day with snacks between.  She does engage in routine physical activity by walking at her work.  She is UTD on eye exam, also follows with podiatry routinely.  She notes she has been on Mounjaro in the past and did well but was taken off of it during hospitalization for TIA in July.  It was re-initiated in October but she notes she did have possible adverse reaction (but also notes she had the COVID vaccine the day before the [facility] injection as well). ) An ACE inhibitor/angiotensin II receptor blocker is being taken. She sees a podiatrist.Eye exam is current.     Review of systems  Constitutional: + decreasing body weight, current Body mass  index is 31.69 kg/m., no fatigue, no subjective hyperthermia, no subjective hypothermia Eyes: no blurry vision, no xerophthalmia ENT: no sore throat, no nodules palpated in throat, no dysphagia/odynophagia, no hoarseness Cardiovascular: no chest pain, no shortness of breath, no palpitations, no leg swelling Respiratory: no cough, no shortness of breath Gastrointestinal: no nausea/vomiting/diarrhea Musculoskeletal: no muscle/joint aches Skin: no rashes, no hyperemia Neurological: no tremors, no numbness, no tingling, no dizziness Psychiatric: no depression, no anxiety  Objective:     BP (!) 126/52 (BP Location: Left Arm, Patient Position: Sitting, Cuff Size: Large)   Pulse (!) 45 Comment: Retake 1 full minute, radial pulse  Ht 5\' 8"  (1.727 m)   Wt 208 lb 6.4 oz (94.5 kg)   BMI 31.69 kg/m   Wt Readings from Last 3 Encounters:  03/18/23 208 lb 6.4 oz (94.5 kg)  01/08/23 215 lb 8 oz (97.8 kg)  10/29/22 221 lb 1.9 oz (100.3 kg)     BP Readings from Last 3 Encounters:  03/18/23 (!) 126/52  01/08/23 132/68  10/30/22 (!) 145/51     Physical Exam- Limited  Constitutional:  Body mass index is 31.69 kg/m. , not in acute distress, normal state of mind Eyes:  EOMI, no exophthalmos Neck: Supple Cardiovascular: bradycardic (Says this is  normal for her), no murmurs, rubs, or gallops, no edema Respiratory: Adequate breathing efforts, no crackles, rales, rhonchi, or wheezing Musculoskeletal: no gross deformities, strength intact in all four extremities, no gross restriction of joint movements Skin:  no rashes, no hyperemia Neurological: no tremor with outstretched hands   Diabetic Foot Exam - Simple   No data filed      CMP ( most recent) CMP     Component Value Date/Time   NA 143 10/29/2022 1701   K 3.1 (L) 10/29/2022 1701   CL 110 10/29/2022 1701   CO2 22 10/29/2022 1642   GLUCOSE 70 10/29/2022 1701   BUN 26 (A) 02/19/2023 0000   CREATININE 1.3 (A) 02/19/2023 0000   CREATININE 1.50 (H) 10/29/2022 1701   CALCIUM 9.0 10/29/2022 1642   PROT 8.0 10/29/2022 1642   ALBUMIN 3.6 10/29/2022 1642   AST 30 10/29/2022 1642   ALT 27 10/29/2022 1642   ALKPHOS 57 10/29/2022 1642   BILITOT 0.6 10/29/2022 1642   EGFR 47 02/19/2023 0000   GFRNONAA 42 (L) 10/29/2022 1642     Diabetic Labs (most recent): Lab Results  Component Value Date   HGBA1C 10.1 (A) 03/18/2023   HGBA1C 7.5 (H) 10/29/2022   HGBA1C 7.5 10/29/2022     Lipid Panel ( most recent) Lipid Panel     Component Value Date/Time   CHOL 96 10/30/2022 0431   TRIG 110 02/19/2023 0000   HDL 42 10/30/2022 0431   CHOLHDL 2.3 10/30/2022 0431   VLDL 11 10/30/2022 0431   LDLCALC 63 02/19/2023 0000               Assessment & Plan:   1) Type 2 diabetes mellitus with hyperglycemia, without long-term current use of insulin (HCC)  She presents today for her consultation with no meter or logs to review, she does not have a meter at home.  Her POCT A1c today is 10.1%, increasing from last visit of 7.5%.  She drinks water, sweet tea, koolaid, soda (although she has cut back), and flavored ICE waters.  She eats on average 2 meals per day with snacks between.  She does engage in routine physical activity by walking at her work.  She  is UTD on eye exam, also  follows with podiatry routinely.  She notes she has been on Mounjaro in the past and did well but was taken off of it during hospitalization for TIA in July.  It was re-initiated in October but she notes she did have possible adverse reaction (but also notes she had the COVID vaccine the day before the [facility] injection as well).   - Jenny Newton has currently uncontrolled symptomatic type 2 DM since 57 years of age, with most recent A1c of 10.1 %.   -Recent labs reviewed.  - I had a long discussion with her about the progressive nature of diabetes and the pathology behind its complications. -her diabetes is complicated by TIA, CKD, retinopathy and she remains at a high risk for more acute and chronic complications which include CAD, CVA, CKD, retinopathy, and neuropathy. These are all discussed in detail with her.  The following Lifestyle Medicine recommendations according to American College of Lifestyle Medicine [facility]) were discussed and offered to patient and she agrees to start the journey:  A. Whole Foods, Plant-based plate comprising of fruits and vegetables, plant-based proteins, whole-grain carbohydrates was discussed in detail with the patient.   A list for source of those nutrients were also provided to the patient.  Patient will use only water or unsweetened tea for hydration. B.  The need to stay away from risky substances including alcohol, smoking; obtaining 7 to 9 hours of restorative sleep, at least 150 minutes of moderate intensity exercise weekly, the importance of healthy social connections,  and stress reduction techniques were discussed. C.  A full color page of  Calorie density of various food groups per pound showing examples of each food groups was provided to the patient.  - I have counseled her on diet and weight management by adopting a carbohydrate restricted/protein rich diet. Patient is encouraged to switch to unprocessed or minimally processed complex starch and  increased protein intake (animal or plant source), fruits, and vegetables. -  she is advised to stick to a routine mealtimes to eat 3 meals a day and avoid unnecessary snacks (to snack only to correct hypoglycemia).   - she acknowledges that there is a room for improvement in her food and drink choices. - Suggestion is made for her to avoid simple carbohydrates from her diet including Cakes, Sweet Desserts, Ice Cream, Soda (diet and regular), Sweet Tea, Candies, Chips, Cookies, Store Bought Juices, Alcohol in Excess of 1-2 drinks a day, Artificial Sweeteners, Coffee Creamer, and "Sugar-free" Products. This will help patient to have more stable blood glucose profile and potentially avoid unintended weight gain.  - I have approached her with the following individualized plan to manage her diabetes and patient agrees:   -She is advised to lower her Metformin to 500 mg po twice daily after meals (lowering due to decline in renal function).  Will restart GLP1 therapy with Trulicity 0.75 mg SQ weekly x 1 month, then will increase to 1.5 mg SQ weekly thereafter if tolerated well.  She is an excellent candidate for GLP1 therapy given history of TIA.  She does not smoke, never has had pancreatitis, no personal or family history of MTC.  -she is encouraged to start monitoring glucose 1 times daily, before breakfast, to log their readings on the clinic sheets provided, and bring them to review at follow up appointment in 3 months.  I did give her sample Accuchek meter from the office today.  - Adjustment parameters are given  to her for hypo and hyperglycemia in writing. - she is encouraged to call clinic for blood glucose levels less than 70 or above 300 mg /dl.  - she is not a candidate for full dose Metformin due to concurrent renal insufficiency.  - Specific targets for  A1c; LDL, HDL, and Triglycerides were discussed with the patient.  2) Blood Pressure /Hypertension:  her blood pressure is controlled  to target.   she is advised to continue her current medications including Amlodipine 5 mg p.o. daily with breakfast, and Losartan/HCT 50/12.5 mg po daily.  3) Lipids/Hyperlipidemia:    Review of her recent lipid panel from 02/19/23 showed controlled LDL at 63 .  she is advised to continue Crestor 10 mg daily at bedtime.  Side effects and precautions discussed with her.  4)  Weight/Diet:  her Body mass index is 31.69 kg/m.  -  clearly complicating her diabetes care.   she is a candidate for weight loss. I discussed with her the fact that loss of 5 - 10% of her  current body weight will have the most impact on her diabetes management.  Exercise, and detailed carbohydrates information provided  -  detailed on discharge instructions.  5) Chronic Care/Health Maintenance: -she is on ACEI/ARB and Statin medications and is encouraged to initiate and continue to follow up with Ophthalmology, Dentist, Podiatrist at least yearly or according to recommendations, and advised to stay away from smoking. I have recommended yearly flu vaccine and pneumonia vaccine at least every 5 years; moderate intensity exercise for up to 150 minutes weekly; and sleep for at least 7 hours a day.  - she is advised to maintain close follow up with Assunta Found, MD for primary care needs, as well as her other providers for optimal and coordinated care.   - Time spent in this patient care: 60 min, of which > 50% was spent in counseling her about her diabetes and the rest reviewing her blood glucose logs, discussing her hypoglycemia and hyperglycemia episodes, reviewing her current and previous labs/studies (including abstraction from other facilities) and medications doses and developing a long term treatment plan based on the latest standards of care/guidelines; and documenting her care.    Please refer to Patient Instructions for Blood Glucose Monitoring and Insulin/Medications Dosing Guide" in media tab for additional  information. Please also refer to "Patient Self Inventory" in the Media tab for reviewed elements of pertinent patient history.  Jenny Newton participated in the discussions, expressed understanding, and voiced agreement with the above plans.  All questions were answered to her satisfaction. she is encouraged to contact clinic should she have any questions or concerns prior to her return visit.     Follow up plan: - Return in about 3 months (around 06/18/2023) for Diabetes F/U with A1c in office, Bring meter and logs.    Ronny Bacon, [facility] [address] [place], [place] [address] Phone: [phone] Fax: [phone]  03/18/2023, 3:34 PM

## 2023-04-04 ENCOUNTER — Other Ambulatory Visit: Payer: Self-pay | Admitting: Nurse Practitioner

## 2023-05-13 ENCOUNTER — Encounter: Payer: Self-pay | Admitting: Nutrition

## 2023-05-13 ENCOUNTER — Encounter: Payer: Managed Care, Other (non HMO) | Attending: Family Medicine | Admitting: Nutrition

## 2023-05-13 VITALS — Ht 68.0 in | Wt 206.0 lb

## 2023-05-13 DIAGNOSIS — E1122 Type 2 diabetes mellitus with diabetic chronic kidney disease: Secondary | ICD-10-CM | POA: Insufficient documentation

## 2023-05-13 DIAGNOSIS — N183 Chronic kidney disease, stage 3 unspecified: Secondary | ICD-10-CM | POA: Diagnosis present

## 2023-05-13 DIAGNOSIS — E669 Obesity, unspecified: Secondary | ICD-10-CM | POA: Insufficient documentation

## 2023-05-13 DIAGNOSIS — G459 Transient cerebral ischemic attack, unspecified: Secondary | ICD-10-CM | POA: Insufficient documentation

## 2023-05-13 DIAGNOSIS — I10 Essential (primary) hypertension: Secondary | ICD-10-CM | POA: Insufficient documentation

## 2023-05-13 NOTE — Progress Notes (Signed)
Medical Nutrition Therapy  Appointment Start time:  0800  Appointment End time:  0900  Primary concerns today: Dm Type 2, CKD  Referral diagnosis: E11.8, N18.3 Preferred learning style: NO Preference  Learning readiness: Ready   NUTRITION ASSESSMENT  58 yr old bfemale referred for Type 2 DM from Ronny Bacon, FNP at [facility]. A1C 10.1%. Currently on Trulicity, Only on 500 mg BID of Metformin due to CKD. Works 3rd shift. Diet is high in salt and sugar from processed foods and sodas, koolaid and tea. Testing blood sugars once a day. Checks it when she gets up from sleeping and it is now 112-130's usually. She is very motivated to make lifestyle changes with Lifestyle Medicine and follow a more whole plant predominant diet to improve her DM and chronic health issues.   Clinical Medical Hx:  Past Medical History:  Diagnosis Date   Anemia    Diabetes mellitus without complication (HCC)    Glaucoma    Hypertension    Osteomyelitis (HCC)    left 3rd toe    Medications:  Current Outpatient Medications on File Prior to Visit  Medication Sig Dispense Refill   Accu-Chek Softclix Lancets lancets Use as instructed to monitor glucose once daily 100 each 12   amLODipine (NORVASC) 10 MG tablet Take 10 mg by mouth daily.     aspirin 81 MG chewable tablet Chew 1 tablet (81 mg total) by mouth daily. 30 tablet 2   Brinzolamide-Brimonidine (SIMBRINZA) 1-0.2 % SUSP Apply to eye.     Cholecalciferol (VITAMIN D3) 50 MCG (2000 UT) capsule Take 2,000 Units by mouth daily.     Dulaglutide (TRULICITY) 0.75 MG/0.5ML SOAJ INJECT 0.75 MG SUBCUTANEOUSLY ONE TIME PER WEEK 2 mL 2   Dulaglutide (TRULICITY) 1.5 MG/0.5ML SOAJ Inject 1.5 mg into the skin once a week. 6 mL 0   glucose blood (ACCU-CHEK GUIDE TEST) test strip Use as instructed to monitor glucose once daily 100 each 12   losartan-hydrochlorothiazide (HYZAAR) 50-12.5 MG tablet Take 1 tablet by mouth daily.     metFORMIN (GLUCOPHAGE)  1000 MG tablet Take 1,000 mg by mouth 2 (two) times daily with a meal.     Netarsudil-Latanoprost (ROCKLATAN) 0.02-0.005 % SOLN Apply to eye.     rosuvastatin (CRESTOR) 10 MG tablet Take 10 mg by mouth daily.     timolol (TIMOPTIC) 0.5 % ophthalmic solution 1 drop 2 (two) times daily.     Vitamin D, Ergocalciferol, (DRISDOL) 1.25 MG (50000 UNIT) CAPS capsule Take by mouth. (Patient not taking: Reported on 03/18/2023)     No current facility-administered medications on file prior to visit.    Labs:  Lab Results  Component Value Date   HGBA1C 10.1 (A) 03/18/2023      Latest Ref Rng & Units 02/19/2023   12:00 AM 10/29/2022    5:01 PM 10/29/2022    4:42 PM  CMP  Glucose 70 - 99 mg/dL  70  76   BUN 4 - 21 26     29  31    Creatinine 0.5 - 1.1 1.3     1.50  1.45   Sodium 135 - 145 mmol/L  143  140   Potassium 3.5 - 5.1 mmol/L  3.1  3.1   Chloride 98 - 111 mmol/L  110  108   CO2 22 - 32 mmol/L   22   Calcium 8.9 - 10.3 mg/dL   9.0   Total Protein 6.5 - 8.1 g/dL   8.0  Total Bilirubin 0.3 - 1.2 mg/dL   0.6   Alkaline Phos 38 - 126 U/L   57   AST 15 - 41 U/L   30   ALT 0 - 44 U/L   27      This result is from an external source.  Lipid Panel     Component Value Date/Time   CHOL 96 10/30/2022 0431   TRIG 110 02/19/2023 0000   HDL 42 10/30/2022 0431   CHOLHDL 2.3 10/30/2022 0431   VLDL 11 10/30/2022 0431   LDLCALC 63 02/19/2023 0000    Notable Signs/Symptoms:   Lifestyle & Dietary Hx LIves with her husband. Works 3rd shift.  Estimated daily fluid intake: 16 oz water; plus soda and koolaid Supplements:  Sleep:  Stress / self-care:  Current average weekly physical activity: ADL  24-Hr Dietary Recall First Meal:  8-9 am Eggs, grits and corn beef hash or Bowl of fruited cheerios, Snack: koolaid,  Second Meal: 2 pm crackers Snack:  Third Meal: 4  pmBowl of spaghettios with meatballs  730 Cabbage and chicken leg 430 am; Pack of crackers. Beverages: water, koolaid and  soda  Estimated Energy Needs Calories: 1500 Carbohydrate: 170g Protein: 112g Fat: 42g   NUTRITION DIAGNOSIS  NB-1.1 Food and nutrition-related knowledge deficit As related to DIabets Type 2.  As evidenced by A1C.   NUTRITION INTERVENTION  Nutrition education (E-1) on the following topics:  Nutrition and Diabetes education provided on My Plate, CHO counting, meal planning, portion sizes, timing of meals, avoiding snacks between meals unless having a low blood sugar, target ranges for A1C and blood sugars, signs/symptoms and treatment of hyper/hypoglycemia, monitoring blood sugars, taking medications as prescribed, benefits of exercising 30 minutes per day and prevention of complications of DM. Lifestyle Medicine  - Whole Food, Plant Predominant Nutrition is highly recommended: Eat Plenty of vegetables, Mushrooms, fruits, Legumes, Whole Grains, Nuts, seeds in lieu of processed meats, processed snacks/pastries red meat, poultry, eggs.    -It is better to avoid simple carbohydrates including: Cakes, Sweet Desserts, Ice Cream, Soda (diet and regular), Sweet Tea, Candies, Chips, Cookies, Store Bought Juices, Alcohol in Excess of  1-2 drinks a day, Lemonade,  Artificial Sweeteners, Doughnuts, Coffee Creamers, "Sugar-free" Products, etc, etc.  This is not a complete list.....  Exercise: If you are able: 30 -60 minutes a day ,4 days a week, or 150 minutes a week.  The longer the better.  Combine stretch, strength, and aerobic activities.  If you were told in the past that you have high risk for cardiovascular diseases, you may seek evaluation by your heart doctor prior to initiating moderate to intense exercise programs.    Handouts Provided Include  Lifestyle Medicine handouts  Learning Style & Readiness for Change Teaching method utilized: Visual & Auditory  Demonstrated degree of understanding via: Teach Back  Barriers to learning/adherence to lifestyle change: none  Goals Established by  Pt Goals  Eat meals at times discussed. 8-10 pm Cut out sodas, koolaid and juices Drink 5-6 bottles of water per day Get A1C down to 7%        Cut out processed foods.  MONITORING & EVALUATION Dietary intake, weekly physical activity, and blood sugars  in 1 month.  Next Steps  Patient is to work on meal planning and meal prepping.Marland Kitchen

## 2023-05-13 NOTE — Patient Instructions (Addendum)
Goals  Eat meals at times discussed. 8-10 pm Cut out sodas, koolaid and juices Drink 5-6 bottles of water per day Get A1C down to 7% Cut out processed foods.

## 2023-06-25 ENCOUNTER — Encounter: Payer: Self-pay | Admitting: Nurse Practitioner

## 2023-06-25 ENCOUNTER — Ambulatory Visit (INDEPENDENT_AMBULATORY_CARE_PROVIDER_SITE_OTHER): Payer: Managed Care, Other (non HMO) | Admitting: Nurse Practitioner

## 2023-06-25 VITALS — BP 128/68 | HR 48 | Ht 68.0 in | Wt 197.0 lb

## 2023-06-25 DIAGNOSIS — I10 Essential (primary) hypertension: Secondary | ICD-10-CM

## 2023-06-25 DIAGNOSIS — E782 Mixed hyperlipidemia: Secondary | ICD-10-CM

## 2023-06-25 DIAGNOSIS — E1165 Type 2 diabetes mellitus with hyperglycemia: Secondary | ICD-10-CM

## 2023-06-25 DIAGNOSIS — Z7984 Long term (current) use of oral hypoglycemic drugs: Secondary | ICD-10-CM | POA: Diagnosis not present

## 2023-06-25 LAB — POCT GLYCOSYLATED HEMOGLOBIN (HGB A1C): Hemoglobin A1C: 7.3 % — AB (ref 4.0–5.6)

## 2023-06-25 MED ORDER — TRULICITY 1.5 MG/0.5ML ~~LOC~~ SOAJ: 1.5000 mg | SUBCUTANEOUS | 1 refills | Status: DC

## 2023-06-25 NOTE — Progress Notes (Signed)
 Endocrinology Follow Up Note       06/25/2023, 10:31 AM   Subjective:    Patient ID: Jenny Newton, female    DOB: Dec 18, 1965.  Jenny Newton is being seen in follow up after being seen in consultation for management of currently uncontrolled symptomatic diabetes requested by  Assunta Found, MD.   Past Medical History:  Diagnosis Date   Anemia    Diabetes mellitus without complication (HCC)    Glaucoma    Hypertension    Osteomyelitis (HCC)    left 3rd toe    Past Surgical History:  Procedure Laterality Date   AMPUTATION TOE Left 06/20/2020   Procedure: Left 3rd toe amputation;  Surgeon: Toni Arthurs, MD;  Location: [facility];  Service: Orthopedics;  Laterality: Left;    AMPUTATION TOE Right 01/02/2021   Procedure: Right 3rd and 4th toe amputations at the middle phalanx;  Surgeon: Toni Arthurs, MD;  Location: [facility];  Service: Orthopedics;  Laterality: Right;   CATARACT EXTRACTION, BILATERAL     eue surgery  2018   EYE SURGERY  2019   left breast surgery  2009    Social History   Socioeconomic History   Marital status: Married    Spouse name: jerry   Number of children: 0   Years of education: Not on file   Highest education level: Bachelor's degree (e.g., BA, AB, BS)  Occupational History   Not on file  Tobacco Use   Smoking status: Never   Smokeless tobacco: Never  Vaping Use   Vaping status: Never Used  Substance and Sexual Activity   Alcohol use: Not Currently   Drug use: Not Currently   Sexual activity: Yes    Birth control/protection: None, Post-menopausal  Other Topics Concern   Not on file  Social History Narrative   Not on file   Social Drivers of Health   Financial Resource Strain: Low Risk  (12/18/2019)   Overall Financial Resource Strain (CARDIA)    Difficulty of Paying Living Expenses: Not hard at all  Food Insecurity: No Food Insecurity  (10/29/2022)   Hunger Vital Sign    Worried About Running Out of Food in the Last Year: Never true    Ran Out of Food in the Last Year: Never true  Transportation Needs: No Transportation Needs (10/29/2022)   PRAPARE - Administrator, Civil Service (Medical): No    Lack of Transportation (Non-Medical): No  Physical Activity: Inactive (12/18/2019)   Exercise Vital Sign    Days of Exercise per Week: 0 days    Minutes of Exercise per Session: 0 min  Stress: No Stress Concern Present (12/18/2019)   [employer] of Occupational Health - Occupational Stress Questionnaire    Feeling of Stress : Not at all  Social Connections: Moderately Integrated (12/18/2019)   Social Connection and Isolation Panel [NHANES]    Frequency of Communication with Friends and Family: Twice a week    Frequency of Social Gatherings with Friends and Family: Once a week    Attends Religious Services: 1 to 4 times per year    Active Member of Clubs or Organizations: No  Attends Banker Meetings: Never    Marital Status: Married    Family History  Problem Relation Age of Onset   Hypertension Father    Diabetes Father     Outpatient Encounter Medications as of 06/25/2023  Medication Sig   Accu-Chek Softclix Lancets lancets Use as instructed to monitor glucose once daily   amLODipine (NORVASC) 10 MG tablet Take 10 mg by mouth daily.   amoxicillin-clavulanate (AUGMENTIN) 875-125 MG tablet Take 1 tablet by mouth 2 (two) times daily.   aspirin 81 MG chewable tablet Chew 1 tablet (81 mg total) by mouth daily.   Brinzolamide-Brimonidine (SIMBRINZA) 1-0.2 % SUSP Apply to eye.   Cholecalciferol (VITAMIN D3) 50 MCG (2000 UT) capsule Take 2,000 Units by mouth daily.   glucose blood (ACCU-CHEK GUIDE TEST) test strip Use as instructed to monitor glucose once daily   losartan-hydrochlorothiazide (HYZAAR) 50-12.5 MG tablet Take 1 tablet by mouth daily.   metFORMIN (GLUCOPHAGE) 1000 MG tablet Take 1,000  mg by mouth 2 (two) times daily with a meal.   Netarsudil-Latanoprost (ROCKLATAN) 0.02-0.005 % SOLN Apply to eye.   rosuvastatin (CRESTOR) 10 MG tablet Take 10 mg by mouth daily.   timolol (TIMOPTIC) 0.5 % ophthalmic solution 1 drop 2 (two) times daily.   Vitamin D, Ergocalciferol, (DRISDOL) 1.25 MG (50000 UNIT) CAPS capsule Take by mouth.   [DISCONTINUED] Dulaglutide (TRULICITY) 0.75 MG/0.5ML SOAJ INJECT 0.75 MG SUBCUTANEOUSLY ONE TIME PER WEEK   [DISCONTINUED] Dulaglutide (TRULICITY) 1.5 MG/0.5ML SOAJ Inject 1.5 mg into the skin once a week.   Dulaglutide (TRULICITY) 1.5 MG/0.5ML SOAJ Inject 1.5 mg into the skin once a week.   No facility-administered encounter medications on file as of 06/25/2023.    ALLERGIES: No Known Allergies  VACCINATION STATUS: Immunization History  Administered Date(s) Administered   Janssen (J&J) SARS-COV-2 Vaccination 09/30/2019    Diabetes She presents for her follow-up diabetic visit. She has type 2 diabetes mellitus. Onset time: diagnosed at approx age of 14. Her disease course has been improving. There are no hypoglycemic associated symptoms. Associated symptoms include fatigue, polydipsia and polyuria. There are no hypoglycemic complications. Symptoms are improving. 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 (Multiple toe amputations from osteomyelitis) Risk factors for coronary artery disease include diabetes mellitus, family history, hypertension and sedentary lifestyle. Current diabetic treatment includes oral agent (monotherapy) (and Trulicity). She is compliant with treatment most of the time. Her weight is decreasing steadily. She is following a generally unhealthy diet. When asked about meal planning, she reported none. She has not had a previous visit with a dietitian. She participates in exercise intermittently. Her home blood glucose trend is decreasing steadily. (She presents today with her logs showing at  goal glycemic profile.  Her POCT A1c today is 7.3%, improving drastically from last visit of 10.1%.  She has worked really hard to eliminate processed foods from her diet, has cut out sodas altogether.  She is tolerating the Trulicity well, no unpleasant side effects.  She denies any hypoglycemia.) An ACE inhibitor/angiotensin II receptor blocker is being taken. She sees a podiatrist.Eye exam is current.     Review of systems  Constitutional: + decreasing body weight, current Body mass index is 29.95 kg/m., no fatigue, no subjective hyperthermia, no subjective hypothermia Eyes: no blurry vision, no xerophthalmia ENT: no sore throat, no nodules palpated in throat, no dysphagia/odynophagia, no hoarseness Cardiovascular: no chest pain, no shortness of breath, no palpitations, no leg swelling Respiratory: no cough, no  shortness of breath Gastrointestinal: no nausea/vomiting/diarrhea Musculoskeletal: no muscle/joint aches Skin: no rashes, no hyperemia Neurological: no tremors, no numbness, no tingling, no dizziness Psychiatric: no depression, no anxiety  Objective:     BP 128/68 (BP Location: Right Arm, Cuff Size: Large)   Pulse (!) 48   Ht 5\' 8"  (1.727 m)   Wt 197 lb (89.4 kg)   BMI 29.95 kg/m   Wt Readings from Last 3 Encounters:  06/25/23 197 lb (89.4 kg)  05/13/23 206 lb (93.4 kg)  03/18/23 208 lb 6.4 oz (94.5 kg)     BP Readings from Last 3 Encounters:  06/25/23 128/68  03/18/23 (!) 126/52  01/08/23 132/68     Physical Exam- Limited  Constitutional:  Body mass index is 29.95 kg/m. , not in acute distress, normal state of mind Eyes:  EOMI, no exophthalmos Musculoskeletal: no gross deformities, strength intact in all four extremities, no gross restriction of joint movements Skin:  no rashes, no hyperemia Neurological: no tremor with outstretched hands   Diabetic Foot Exam - Simple   No data filed      CMP ( most recent) CMP     Component Value Date/Time   NA  143 10/29/2022 1701   K 3.1 (L) 10/29/2022 1701   CL 110 10/29/2022 1701   CO2 22 10/29/2022 1642   GLUCOSE 70 10/29/2022 1701   BUN 26 (A) 02/19/2023 0000   CREATININE 1.3 (A) 02/19/2023 0000   CREATININE 1.50 (H) 10/29/2022 1701   CALCIUM 9.0 10/29/2022 1642   PROT 8.0 10/29/2022 1642   ALBUMIN 3.6 10/29/2022 1642   AST 30 10/29/2022 1642   ALT 27 10/29/2022 1642   ALKPHOS 57 10/29/2022 1642   BILITOT 0.6 10/29/2022 1642   EGFR 47 02/19/2023 0000   GFRNONAA 42 (L) 10/29/2022 1642     Diabetic Labs (most recent): Lab Results  Component Value Date   HGBA1C 7.3 (A) 06/25/2023   HGBA1C 10.1 (A) 03/18/2023   HGBA1C 7.5 (H) 10/29/2022     Lipid Panel ( most recent) Lipid Panel     Component Value Date/Time   CHOL 96 10/30/2022 0431   TRIG 110 02/19/2023 0000   HDL 42 10/30/2022 0431   CHOLHDL 2.3 10/30/2022 0431   VLDL 11 10/30/2022 0431   LDLCALC 63 02/19/2023 0000               Assessment & Plan:   1) Type 2 diabetes mellitus with hyperglycemia, without long-term current use of insulin (HCC)  She presents today with her logs showing at goal glycemic profile.  Her POCT A1c today is 7.3%, improving drastically from last visit of 10.1%.  She has worked really hard to eliminate processed foods from her diet, has cut out sodas altogether.  She is tolerating the Trulicity well, no unpleasant side effects.  She denies any hypoglycemia.  - Jenny Newton has currently uncontrolled symptomatic type 2 DM since 58 years of age.   -Recent labs reviewed.  - I had a long discussion with her about the progressive nature of diabetes and the pathology behind its complications. -her diabetes is complicated by TIA, CKD, retinopathy and she remains at a high risk for more acute and chronic complications which include CAD, CVA, CKD, retinopathy, and neuropathy. These are all discussed in detail with her.  The following Lifestyle Medicine recommendations according to American  College of Lifestyle Medicine [facility]) were discussed and offered to patient and she agrees to start the journey:  A.  Whole Foods, Plant-based plate comprising of fruits and vegetables, plant-based proteins, whole-grain carbohydrates was discussed in detail with the patient.   A list for source of those nutrients were also provided to the patient.  Patient will use only water or unsweetened tea for hydration. B.  The need to stay away from risky substances including alcohol, smoking; obtaining 7 to 9 hours of restorative sleep, at least 150 minutes of moderate intensity exercise weekly, the importance of healthy social connections,  and stress reduction techniques were discussed. C.  A full color page of  Calorie density of various food groups per pound showing examples of each food groups was provided to the patient.  - Nutritional counseling repeated at each appointment due to patients tendency to fall back in to old habits.  - The patient admits there is a room for improvement in their diet and drink choices. -  Suggestion is made for the patient to avoid simple carbohydrates from their diet including Cakes, Sweet Desserts / Pastries, Ice Cream, Soda (diet and regular), Sweet Tea, Candies, Chips, Cookies, Sweet Pastries, Store Bought Juices, Alcohol in Excess of 1-2 drinks a day, Artificial Sweeteners, Coffee Creamer, and "Sugar-free" Products. This will help patient to have stable blood glucose profile and potentially avoid unintended weight gain.   - I encouraged the patient to switch to unprocessed or minimally processed complex starch and increased protein intake (animal or plant source), fruits, and vegetables.   - Patient is advised to stick to a routine mealtimes to eat 3 meals a day and avoid unnecessary snacks (to snack only to correct hypoglycemia).  - I have approached her with the following individualized plan to manage her diabetes and patient agrees:   -Given her overall improvement,  no changes will be made to her medications today.  She is advised to continue Metformin 500 mg twice daily and Trulicity 1.5 mg SQ weekly.   -she is encouraged to start monitoring glucose 1 times daily (2-3 times per week), before breakfast, and to call the clinic if she has readings less than 70 or above 200 for 3 tests in a row.    - Adjustment parameters are given to her for hypo and hyperglycemia in writing.  - she is not a candidate for full dose Metformin due to concurrent renal insufficiency.  - Specific targets for  A1c; LDL, HDL, and Triglycerides were discussed with the patient.  2) Blood Pressure /Hypertension:  her blood pressure is controlled to target.   she is advised to continue her current medications including Amlodipine 5 mg p.o. daily with breakfast, and Losartan/HCT 50/12.5 mg po daily.  3) Lipids/Hyperlipidemia:    Review of her recent lipid panel from 02/19/23 showed controlled LDL at 63 .  she is advised to continue Crestor 10 mg daily at bedtime.  Side effects and precautions discussed with her.  4)  Weight/Diet:  her Body mass index is 29.95 kg/m.  -  clearly complicating her diabetes care.   she is a candidate for weight loss. I discussed with her the fact that loss of 5 - 10% of her  current body weight will have the most impact on her diabetes management.  Exercise, and detailed carbohydrates information provided  -  detailed on discharge instructions.  5) Chronic Care/Health Maintenance: -she is on ACEI/ARB and Statin medications and is encouraged to initiate and continue to follow up with Ophthalmology, Dentist, Podiatrist at least yearly or according to recommendations, and advised to stay away from smoking.  I have recommended yearly flu vaccine and pneumonia vaccine at least every 5 years; moderate intensity exercise for up to 150 minutes weekly; and sleep for at least 7 hours a day.  - she is advised to maintain close follow up with Assunta Found, MD for  primary care needs, as well as her other providers for optimal and coordinated care.    I spent  26  minutes in the care of the patient today including review of labs from CMP, Lipids, Thyroid Function, Hematology (current and previous including abstractions from other facilities); face-to-face time discussing  her blood glucose readings/logs, discussing hypoglycemia and hyperglycemia episodes and symptoms, medications doses, her options of short and long term treatment based on the latest standards of care / guidelines;  discussion about incorporating lifestyle medicine;  and documenting the encounter. Risk reduction counseling performed per USPSTF guidelines to reduce obesity and cardiovascular risk factors.     Please refer to Patient Instructions for Blood Glucose Monitoring and Insulin/Medications Dosing Guide"  in media tab for additional information. Please  also refer to " Patient Self Inventory" in the Media  tab for reviewed elements of pertinent patient history.  Jenny Newton participated in the discussions, expressed understanding, and voiced agreement with the above plans.  All questions were answered to her satisfaction. she is encouraged to contact clinic should she have any questions or concerns prior to her return visit.     Follow up plan: - Return in about 4 months (around 10/23/2023) for Diabetes F/U with A1c in office, No previsit labs.   Ronny Bacon, [facility] [address] [place], [place] [address] Phone: [phone] Fax: [phone]  06/25/2023, 10:31 AM

## 2023-06-28 ENCOUNTER — Ambulatory Visit: Payer: Managed Care, Other (non HMO) | Admitting: Nutrition

## 2023-06-28 ENCOUNTER — Encounter: Payer: Managed Care, Other (non HMO) | Attending: Family Medicine | Admitting: Nutrition

## 2023-06-28 VITALS — Ht 68.0 in | Wt 199.0 lb

## 2023-06-28 DIAGNOSIS — N183 Chronic kidney disease, stage 3 unspecified: Secondary | ICD-10-CM | POA: Insufficient documentation

## 2023-06-28 DIAGNOSIS — E1122 Type 2 diabetes mellitus with diabetic chronic kidney disease: Secondary | ICD-10-CM | POA: Diagnosis present

## 2023-06-28 DIAGNOSIS — I10 Essential (primary) hypertension: Secondary | ICD-10-CM | POA: Insufficient documentation

## 2023-06-28 DIAGNOSIS — G459 Transient cerebral ischemic attack, unspecified: Secondary | ICD-10-CM | POA: Insufficient documentation

## 2023-06-28 DIAGNOSIS — E669 Obesity, unspecified: Secondary | ICD-10-CM | POA: Diagnosis present

## 2023-06-28 NOTE — Progress Notes (Signed)
 Medical Nutrition Therapy  Appointment Start time:  0930  Appointment End time:  1000 Primary concerns today: Dm Type 2, CKD  Referral diagnosis: E11.8, N18.3 Preferred learning style: NO Preference  Learning readiness: Ready   NUTRITION ASSESSMENT Follow up  DM 58 yr old bfemale referred for Type 2 DM from Ronny Bacon, FNP at [facility]. A1C 7.3% down from 10.1%. Currently on Trulicity, Only on 500 mg BID of Metformin due to CKD.  Working on better food choices, cutting out canned and processed foods Cut out sodas and tea. Drinking more water. Goals set previously: Eat meals at times discussed. 8-10 pm-- Done Cut out sodas, koolaid and juices-Yes improved a lot.  Drink 5-6 bottles of water per day-doing better Get A1C down to 7%        Cut out processed foods.-working on it. Clinical Medical Hx:  Past Medical History:  Diagnosis Date   Anemia    Diabetes mellitus without complication (HCC)    Glaucoma    Hypertension    Osteomyelitis (HCC)    left 3rd toe    Medications:  Current Outpatient Medications on File Prior to Visit  Medication Sig Dispense Refill   Accu-Chek Softclix Lancets lancets Use as instructed to monitor glucose once daily 100 each 12   amLODipine (NORVASC) 10 MG tablet Take 10 mg by mouth daily.     amoxicillin-clavulanate (AUGMENTIN) 875-125 MG tablet Take 1 tablet by mouth 2 (two) times daily.     aspirin 81 MG chewable tablet Chew 1 tablet (81 mg total) by mouth daily. 30 tablet 2   Brinzolamide-Brimonidine (SIMBRINZA) 1-0.2 % SUSP Apply to eye.     Cholecalciferol (VITAMIN D3) 50 MCG (2000 UT) capsule Take 2,000 Units by mouth daily.     Dulaglutide (TRULICITY) 1.5 MG/0.5ML SOAJ Inject 1.5 mg into the skin once a week. 6 mL 1   glucose blood (ACCU-CHEK GUIDE TEST) test strip Use as instructed to monitor glucose once daily 100 each 12   losartan-hydrochlorothiazide (HYZAAR) 50-12.5 MG tablet Take 1 tablet by mouth daily.     metFORMIN  (GLUCOPHAGE) 1000 MG tablet Take 1,000 mg by mouth 2 (two) times daily with a meal.     Netarsudil-Latanoprost (ROCKLATAN) 0.02-0.005 % SOLN Apply to eye.     rosuvastatin (CRESTOR) 10 MG tablet Take 10 mg by mouth daily.     timolol (TIMOPTIC) 0.5 % ophthalmic solution 1 drop 2 (two) times daily.     Vitamin D, Ergocalciferol, (DRISDOL) 1.25 MG (50000 UNIT) CAPS capsule Take by mouth.     No current facility-administered medications on file prior to visit.    Labs:  Lab Results  Component Value Date   HGBA1C 7.3 (A) 06/25/2023      Latest Ref Rng & Units 02/19/2023   12:00 AM 10/29/2022    5:01 PM 10/29/2022    4:42 PM  CMP  Glucose 70 - 99 mg/dL  70  76   BUN 4 - 21 26     29  31    Creatinine 0.5 - 1.1 1.3     1.50  1.45   Sodium 135 - 145 mmol/L  143  140   Potassium 3.5 - 5.1 mmol/L  3.1  3.1   Chloride 98 - 111 mmol/L  110  108   CO2 22 - 32 mmol/L   22   Calcium 8.9 - 10.3 mg/dL   9.0   Total Protein 6.5 - 8.1 g/dL   8.0   Total  Bilirubin 0.3 - 1.2 mg/dL   0.6   Alkaline Phos 38 - 126 U/L   57   AST 15 - 41 U/L   30   ALT 0 - 44 U/L   27      This result is from an external source.  Lipid Panel     Component Value Date/Time   CHOL 96 10/30/2022 0431   TRIG 110 02/19/2023 0000   HDL 42 10/30/2022 0431   CHOLHDL 2.3 10/30/2022 0431   VLDL 11 10/30/2022 0431   LDLCALC 63 02/19/2023 0000    Notable Signs/Symptoms:   Lifestyle & Dietary Hx LIves with her husband. Works 3rd shift.  Estimated daily fluid intake: 16 oz water; plus soda and koolaid Supplements:  Sleep:  Stress / self-care:  Current average weekly physical activity: ADL  24-Hr Dietary Recall Working on eating 3 meals per day  Estimated Energy Needs Calories: 1500 Carbohydrate: 170g Protein: 112g Fat: 42g   NUTRITION DIAGNOSIS  NB-1.1 Food and nutrition-related knowledge deficit As related to DIabets Type 2.  As evidenced by A1C.   NUTRITION INTERVENTION  Nutrition education (E-1) on  the following topics:  Nutrition and Diabetes education provided on My Plate, CHO counting, meal planning, portion sizes, timing of meals, avoiding snacks between meals unless having a low blood sugar, target ranges for A1C and blood sugars, signs/symptoms and treatment of hyper/hypoglycemia, monitoring blood sugars, taking medications as prescribed, benefits of exercising 30 minutes per day and prevention of complications of DM. Lifestyle Medicine  - Whole Food, Plant Predominant Nutrition is highly recommended: Eat Plenty of vegetables, Mushrooms, fruits, Legumes, Whole Grains, Nuts, seeds in lieu of processed meats, processed snacks/pastries red meat, poultry, eggs.    -It is better to avoid simple carbohydrates including: Cakes, Sweet Desserts, Ice Cream, Soda (diet and regular), Sweet Tea, Candies, Chips, Cookies, Store Bought Juices, Alcohol in Excess of  1-2 drinks a day, Lemonade,  Artificial Sweeteners, Doughnuts, Coffee Creamers, "Sugar-free" Products, etc, etc.  This is not a complete list.....  Exercise: If you are able: 30 -60 minutes a day ,4 days a week, or 150 minutes a week.  The longer the better.  Combine stretch, strength, and aerobic activities.  If you were told in the past that you have high risk for cardiovascular diseases, you may seek evaluation by your heart doctor prior to initiating moderate to intense exercise programs.    Handouts Provided Include  Lifestyle Medicine handouts  Learning Style & Readiness for Change Teaching method utilized: Visual & Auditory  Demonstrated degree of understanding via: Teach Back  Barriers to learning/adherence to lifestyle change: none  Goals Established by Pt   Work on eating meals on schedule  Find more substitutes for craackers and chips and choose more fruit and veggies. Drink 4 bottles of water per day. Ask for referral to kidney specialist. Ask Dr. Phillips Odor about switching to Lisinopril for kidney protection for BP if  appropriate.  MONITORING & EVALUATION Dietary intake, weekly physical activity, and blood sugars  in 1 month.  Next Steps  Patient is to work on meal planning and meal prepping.Marland Kitchen

## 2023-06-28 NOTE — Patient Instructions (Addendum)
 Goals  Work on eating meals on schedule  Find more substitutes for craackers and chips and choose more fruit and veggies. Drink 4 bottles of water per day. Ask for referral to kidney specialist. Ask Dr. Phillips Odor about switching to Lisinopril for kidney protection for BP if appropriate.

## 2023-06-30 ENCOUNTER — Encounter: Payer: Self-pay | Admitting: Podiatry

## 2023-06-30 ENCOUNTER — Ambulatory Visit (INDEPENDENT_AMBULATORY_CARE_PROVIDER_SITE_OTHER): Payer: Managed Care, Other (non HMO) | Admitting: Podiatry

## 2023-06-30 ENCOUNTER — Ambulatory Visit (INDEPENDENT_AMBULATORY_CARE_PROVIDER_SITE_OTHER)

## 2023-06-30 VITALS — Ht 68.0 in | Wt 199.0 lb

## 2023-06-30 DIAGNOSIS — L089 Local infection of the skin and subcutaneous tissue, unspecified: Secondary | ICD-10-CM

## 2023-06-30 DIAGNOSIS — L97511 Non-pressure chronic ulcer of other part of right foot limited to breakdown of skin: Secondary | ICD-10-CM

## 2023-06-30 NOTE — Progress Notes (Signed)
 Subjective:   Patient ID: Jenny Newton, female   DOB: 58 y.o.   MRN: [id number]   HPI Patient presents with husband with a chronic lesion on the right fifth toe that was infected and she is been on antibiotics it seems better but they were concerned about this and wanted to see   ROS      Objective:  Physical Exam  Neuro ocular status mildly diminished but when I talked about claudication she is able to work and walk distances without cramping or pain in her legs.  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  I discussed her x-rays and discussed the possibility for digital amputation if symptoms were to come back significantly and she will be seen back as needed will get a try to hold off on antibiotics but may require another course if she starts develop redness or other pathology.  Patient will be seen back on an as-needed basis understanding amputation is a possibility in future  X-rays did indicate some large and had a proximal phalanx digit 5 right do not think there is osteo but I was not able to compare this to any previous x-rays

## 2023-10-25 ENCOUNTER — Encounter: Attending: Family Medicine | Admitting: Nutrition

## 2023-10-25 ENCOUNTER — Encounter: Payer: Self-pay | Admitting: Nurse Practitioner

## 2023-10-25 ENCOUNTER — Encounter: Payer: Self-pay | Admitting: Nutrition

## 2023-10-25 ENCOUNTER — Ambulatory Visit (INDEPENDENT_AMBULATORY_CARE_PROVIDER_SITE_OTHER): Payer: Managed Care, Other (non HMO) | Admitting: Nurse Practitioner

## 2023-10-25 VITALS — Ht 68.0 in | Wt 189.0 lb

## 2023-10-25 VITALS — BP 112/62 | HR 57 | Ht 68.0 in | Wt 189.0 lb

## 2023-10-25 DIAGNOSIS — E1165 Type 2 diabetes mellitus with hyperglycemia: Secondary | ICD-10-CM | POA: Diagnosis not present

## 2023-10-25 DIAGNOSIS — E669 Obesity, unspecified: Secondary | ICD-10-CM | POA: Diagnosis present

## 2023-10-25 DIAGNOSIS — G459 Transient cerebral ischemic attack, unspecified: Secondary | ICD-10-CM | POA: Diagnosis present

## 2023-10-25 DIAGNOSIS — I10 Essential (primary) hypertension: Secondary | ICD-10-CM

## 2023-10-25 DIAGNOSIS — N183 Chronic kidney disease, stage 3 unspecified: Secondary | ICD-10-CM | POA: Insufficient documentation

## 2023-10-25 DIAGNOSIS — Z7985 Long-term (current) use of injectable non-insulin antidiabetic drugs: Secondary | ICD-10-CM

## 2023-10-25 DIAGNOSIS — E1122 Type 2 diabetes mellitus with diabetic chronic kidney disease: Secondary | ICD-10-CM | POA: Insufficient documentation

## 2023-10-25 DIAGNOSIS — Z7984 Long term (current) use of oral hypoglycemic drugs: Secondary | ICD-10-CM

## 2023-10-25 DIAGNOSIS — E782 Mixed hyperlipidemia: Secondary | ICD-10-CM | POA: Diagnosis not present

## 2023-10-25 LAB — POCT GLYCOSYLATED HEMOGLOBIN (HGB A1C): Hemoglobin A1C: 6.5 % — AB (ref 4.0–5.6)

## 2023-10-25 NOTE — Progress Notes (Signed)
 Endocrinology Follow Up Note       10/25/2023, 11:44 AM   Subjective:    Patient ID: Jenny Newton, female    DOB: 03-26-1966.  Jenny Newton is being seen in follow up after being seen in consultation for management of currently uncontrolled symptomatic diabetes requested by  Marvine Rush, MD.   Past Medical History:  Diagnosis Date   Anemia    Diabetes mellitus without complication (HCC)    Glaucoma    Hypertension    Osteomyelitis (HCC)    left 3rd toe    Past Surgical History:  Procedure Laterality Date   AMPUTATION TOE Left 06/20/2020   Procedure: Left 3rd toe amputation;  Surgeon: Kit Rush, MD;  Location: [facility];  Service: Orthopedics;  Laterality: Left;    AMPUTATION TOE Right 01/02/2021   Procedure: Right 3rd and 4th toe amputations at the middle phalanx;  Surgeon: Kit Rush, MD;  Location: [facility];  Service: Orthopedics;  Laterality: Right;   CATARACT EXTRACTION, BILATERAL     eue surgery  2018   EYE SURGERY  2019   left breast surgery  2009    Social History   Socioeconomic History   Marital status: Married    Spouse name: jerry   Number of children: 0   Years of education: Not on file   Highest education level: Bachelor's degree (e.g., BA, AB, BS)  Occupational History   Not on file  Tobacco Use   Smoking status: Never   Smokeless tobacco: Never  Vaping Use   Vaping status: Never Used  Substance and Sexual Activity   Alcohol use: Not Currently   Drug use: Not Currently   Sexual activity: Yes    Birth control/protection: None, Post-menopausal  Other Topics Concern   Not on file  Social History Narrative   Not on file   Social Drivers of Health   Financial Resource Strain: Low Risk  (12/18/2019)   Overall Financial Resource Strain (CARDIA)    Difficulty of Paying Living Expenses: Not hard at all  Food Insecurity: No Food Insecurity  (10/29/2022)   Hunger Vital Sign    Worried About Running Out of Food in the Last Year: Never true    Ran Out of Food in the Last Year: Never true  Transportation Needs: No Transportation Needs (10/29/2022)   PRAPARE - Administrator, Civil Service (Medical): No    Lack of Transportation (Non-Medical): No  Physical Activity: Inactive (12/18/2019)   Exercise Vital Sign    Days of Exercise per Week: 0 days    Minutes of Exercise per Session: 0 min  Stress: No Stress Concern Present (12/18/2019)   [employer] of Occupational Health - Occupational Stress Questionnaire    Feeling of Stress : Not at all  Social Connections: Moderately Integrated (12/18/2019)   Social Connection and Isolation Panel    Frequency of Communication with Friends and Family: Twice a week    Frequency of Social Gatherings with Friends and Family: Once a week    Attends Religious Services: 1 to 4 times per year    Active Member of Clubs or Organizations: No  Attends Banker Meetings: Never    Marital Status: Married    Family History  Problem Relation Age of Onset   Hypertension Father    Diabetes Father     Outpatient Encounter Medications as of 10/25/2023  Medication Sig   Accu-Chek Softclix Lancets lancets Use as instructed to monitor glucose once daily   amLODipine (NORVASC) 10 MG tablet Take 10 mg by mouth daily.   aspirin  81 MG chewable tablet Chew 1 tablet (81 mg total) by mouth daily.   Brinzolamide-Brimonidine (SIMBRINZA) 1-0.2 % SUSP Apply to eye.   Dulaglutide  (TRULICITY ) 1.5 MG/0.5ML SOAJ Inject 1.5 mg into the skin once a week.   glucose blood (ACCU-CHEK GUIDE TEST) test strip Use as instructed to monitor glucose once daily   losartan-hydrochlorothiazide (HYZAAR) 50-12.5 MG tablet Take 1 tablet by mouth daily.   metFORMIN (GLUCOPHAGE) 1000 MG tablet Take 1,000 mg by mouth 2 (two) times daily with a meal.   Netarsudil-Latanoprost (ROCKLATAN) 0.02-0.005 % SOLN Apply to  eye.   rosuvastatin (CRESTOR) 10 MG tablet Take 10 mg by mouth daily.   timolol (TIMOPTIC) 0.5 % ophthalmic solution 1 drop 2 (two) times daily.   amoxicillin-clavulanate (AUGMENTIN) 875-125 MG tablet Take 1 tablet by mouth 2 (two) times daily.   Cholecalciferol (VITAMIN D3) 50 MCG (2000 UT) capsule Take 2,000 Units by mouth daily.   No facility-administered encounter medications on file as of 10/25/2023.    ALLERGIES: No Known Allergies  VACCINATION STATUS: Immunization History  Administered Date(s) Administered   Janssen (J&J) SARS-COV-2 Vaccination 09/30/2019    Diabetes She presents for her follow-up diabetic visit. She has type 2 diabetes mellitus. Onset time: diagnosed at approx age of 54. Her disease course has been improving. There are no hypoglycemic associated symptoms. Pertinent negatives for diabetes include no fatigue, no polydipsia and no polyuria. There are no hypoglycemic complications. Symptoms are improving. 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 (Multiple toe amputations from osteomyelitis) Risk factors for coronary artery disease include diabetes mellitus, family history, hypertension and sedentary lifestyle. Current diabetic treatment includes oral agent (monotherapy) (and Trulicity ). She is compliant with treatment most of the time. Her weight is decreasing steadily. She is following a generally unhealthy diet. When asked about meal planning, she reported none. She has not had a previous visit with a dietitian. She participates in exercise intermittently. Her home blood glucose trend is fluctuating minimally. Her breakfast blood glucose range is generally 110-130 mg/dl. (She presents today with her logs showing at goal glycemic profile.  Her POCT A1c today is 6.5%, improving from last visit of 7.3%.  She has worked really hard to eliminate processed foods from her diet, has cut out sodas altogether.  She is tolerating the Trulicity   well, no unpleasant side effects.  She denies any hypoglycemia.) An ACE inhibitor/angiotensin II receptor blocker is being taken. She sees a podiatrist.Eye exam is current.     Review of systems  Constitutional: + decreasing body weight, current Body mass index is 28.74 kg/m., no fatigue, no subjective hyperthermia, no subjective hypothermia Eyes: no blurry vision, no xerophthalmia ENT: no sore throat, no nodules palpated in throat, no dysphagia/odynophagia, no hoarseness Cardiovascular: no chest pain, no shortness of breath, no palpitations, no leg swelling Respiratory: no cough, no shortness of breath Gastrointestinal: no nausea/vomiting/diarrhea Musculoskeletal: no muscle/joint aches Skin: no rashes, no hyperemia Neurological: no tremors, no numbness, no tingling, + dizziness- having trouble with vertigo recently Psychiatric: no depression, no anxiety  Objective:     BP 112/62 (BP Location: Right Arm, Patient Position: Sitting, Cuff Size: Large)   Pulse (!) 57   Ht 5' 8 (1.727 m)   Wt 189 lb (85.7 kg)   BMI 28.74 kg/m   Wt Readings from Last 3 Encounters:  10/25/23 189 lb (85.7 kg)  06/30/23 199 lb (90.3 kg)  06/28/23 199 lb (90.3 kg)     BP Readings from Last 3 Encounters:  10/25/23 112/62  06/25/23 128/68  03/18/23 (!) 126/52     Physical Exam- Limited  Constitutional:  Body mass index is 28.74 kg/m. , not in acute distress, normal state of mind Eyes:  EOMI, no exophthalmos Musculoskeletal: no gross deformities, strength intact in all four extremities, no gross restriction of joint movements Skin:  no rashes, no hyperemia Neurological: no tremor with outstretched hands   Diabetic Foot Exam - Simple   Simple Foot Form Visual Inspection See comments: Yes Sensation Testing Intact to touch and monofilament testing bilaterally: Yes Pulse Check Posterior Tibialis and Dorsalis pulse intact bilaterally: Yes Comments Missing tips to right 3-4th toes, left foot  2nd toe amputated previously      CMP ( most recent) CMP     Component Value Date/Time   NA 143 10/29/2022 1701   K 3.1 (L) 10/29/2022 1701   CL 110 10/29/2022 1701   CO2 22 10/29/2022 1642   GLUCOSE 70 10/29/2022 1701   BUN 26 (A) 02/19/2023 0000   CREATININE 1.3 (A) 02/19/2023 0000   CREATININE 1.50 (H) 10/29/2022 1701   CALCIUM 9.0 10/29/2022 1642   PROT 8.0 10/29/2022 1642   ALBUMIN 3.6 10/29/2022 1642   AST 30 10/29/2022 1642   ALT 27 10/29/2022 1642   ALKPHOS 57 10/29/2022 1642   BILITOT 0.6 10/29/2022 1642   EGFR 47 02/19/2023 0000   GFRNONAA 42 (L) 10/29/2022 1642     Diabetic Labs (most recent): Lab Results  Component Value Date   HGBA1C 6.5 (A) 10/25/2023   HGBA1C 7.3 (A) 06/25/2023   HGBA1C 10.1 (A) 03/18/2023     Lipid Panel ( most recent) Lipid Panel     Component Value Date/Time   CHOL 96 10/30/2022 0431   TRIG 110 02/19/2023 0000   HDL 42 10/30/2022 0431   CHOLHDL 2.3 10/30/2022 0431   VLDL 11 10/30/2022 0431   LDLCALC 63 02/19/2023 0000               Assessment & Plan:   1) Type 2 diabetes mellitus with hyperglycemia, without long-term current use of insulin  (HCC)  She presents today with her logs showing at goal glycemic profile.  Her POCT A1c today is 6.5%, improving from last visit of 7.3%.  She has worked really hard to eliminate processed foods from her diet, has cut out sodas altogether.  She is tolerating the Trulicity  well, no unpleasant side effects.  She denies any hypoglycemia.  - Jenny Newton has currently uncontrolled symptomatic type 2 DM since 58 years of age.   -Recent labs reviewed.  - I had a long discussion with her about the progressive nature of diabetes and the pathology behind its complications. -her diabetes is complicated by TIA, CKD, retinopathy and she remains at a high risk for more acute and chronic complications which include CAD, CVA, CKD, retinopathy, and neuropathy. These are all discussed in detail  with her.  The following Lifestyle Medicine recommendations according to American College of Lifestyle Medicine [facility]) were discussed and offered to patient and  she agrees to start the journey:  A. Whole Foods, Plant-based plate comprising of fruits and vegetables, plant-based proteins, whole-grain carbohydrates was discussed in detail with the patient.   A list for source of those nutrients were also provided to the patient.  Patient will use only water or unsweetened tea for hydration. B.  The need to stay away from risky substances including alcohol, smoking; obtaining 7 to 9 hours of restorative sleep, at least 150 minutes of moderate intensity exercise weekly, the importance of healthy social connections,  and stress reduction techniques were discussed. C.  A full color page of  Calorie density of various food groups per pound showing examples of each food groups was provided to the patient.  - Nutritional counseling repeated at each appointment due to patients tendency to fall back in to old habits.  - The patient admits there is a room for improvement in their diet and drink choices. -  Suggestion is made for the patient to avoid simple carbohydrates from their diet including Cakes, Sweet Desserts / Pastries, Ice Cream, Soda (diet and regular), Sweet Tea, Candies, Chips, Cookies, Sweet Pastries, Store Bought Juices, Alcohol in Excess of 1-2 drinks a day, Artificial Sweeteners, Coffee Creamer, and Sugar-free Products. This will help patient to have stable blood glucose profile and potentially avoid unintended weight gain.   - I encouraged the patient to switch to unprocessed or minimally processed complex starch and increased protein intake (animal or plant source), fruits, and vegetables.   - Patient is advised to stick to a routine mealtimes to eat 3 meals a day and avoid unnecessary snacks (to snack only to correct hypoglycemia).  - I have approached her with the following individualized  plan to manage her diabetes and patient agrees:   -Given her overall improvement, no changes will be made to her medications today.  She is advised to continue Metformin 500 mg twice daily and Trulicity  1.5 mg SQ weekly.  May try reducing Metformin at next visit.  -she is encouraged to start monitoring glucose 1 times daily (2-3 times per week), before breakfast, and to call the clinic if she has readings less than 70 or above 200 for 3 tests in a row.    - Adjustment parameters are given to her for hypo and hyperglycemia in writing.  - she is not a candidate for full dose Metformin due to concurrent renal insufficiency.  - Specific targets for  A1c; LDL, HDL, and Triglycerides were discussed with the patient.  2) Blood Pressure /Hypertension:  her blood pressure is controlled to target.   she is advised to continue her current medications including Amlodipine 5 mg p.o. daily with breakfast, and Losartan/HCT 50/12.5 mg po daily.  3) Lipids/Hyperlipidemia:    Review of her recent lipid panel from 06/22/23 showed controlled LDL at 61 .  she is advised to continue Crestor 10 mg daily at bedtime.  Side effects and precautions discussed with her.  4)  Weight/Diet:  her Body mass index is 28.74 kg/m.  -  clearly complicating her diabetes care.   she is a candidate for weight loss. I discussed with her the fact that loss of 5 - 10% of her  current body weight will have the most impact on her diabetes management.  Exercise, and detailed carbohydrates information provided  -  detailed on discharge instructions.  5) Chronic Care/Health Maintenance: -she is on ACEI/ARB and Statin medications and is encouraged to initiate and continue to follow up with Ophthalmology, Dentist,  Podiatrist at least yearly or according to recommendations, and advised to stay away from smoking. I have recommended yearly flu vaccine and pneumonia vaccine at least every 5 years; moderate intensity exercise for up to 150 minutes  weekly; and sleep for at least 7 hours a day.  - she is advised to maintain close follow up with Marvine Rush, MD for primary care needs, as well as her other providers for optimal and coordinated care.     I spent  32  minutes in the care of the patient today including review of labs from CMP, Lipids, Thyroid Function, Hematology (current and previous including abstractions from other facilities); face-to-face time discussing  her blood glucose readings/logs, discussing hypoglycemia and hyperglycemia episodes and symptoms, medications doses, her options of short and long term treatment based on the latest standards of care / guidelines;  discussion about incorporating lifestyle medicine;  and documenting the encounter. Risk reduction counseling performed per USPSTF guidelines to reduce obesity and cardiovascular risk factors.     Please refer to Patient Instructions for Blood Glucose Monitoring and Insulin /Medications Dosing Guide  in media tab for additional information. Please  also refer to  Patient Self Inventory in the Media  tab for reviewed elements of pertinent patient history.  Jenny Newton participated in the discussions, expressed understanding, and voiced agreement with the above plans.  All questions were answered to her satisfaction. she is encouraged to contact clinic should she have any questions or concerns prior to her return visit.     Follow up plan: - Return in about 4 months (around 02/24/2024) for Diabetes F/U with A1c in office, No previsit labs.   Benton Rio, [facility] [address] [place], [place] [address] Phone: [phone] Fax: [phone]  10/25/2023, 11:44 AM

## 2023-10-25 NOTE — Patient Instructions (Addendum)
 [facility] [address] [place] [facility].  Keep up the great job!!

## 2023-10-25 NOTE — Progress Notes (Signed)
 Medical Nutrition Therapy  Appointment Start time:  1150  Appointment End time:  1206 Primary concerns today: Dm Type 2, CKD  Referral diagnosis: E11.8, N18.3 Preferred learning style: NO Preference  Learning readiness: Ready   NUTRITION ASSESSMENT Follow up  DM Saw Whitney Today. Lost 10 lbs. A1C 6.5% , down from 7.3%. Changes: eating meals better on time. Eating more vegetables and fruits, and grilled meats Leaving the chips and proceed foods alone. Drinking a lot more water Gets in walking  at work. On Trulicity  for her DM and Metformin  Clinical Medical Hx:  Past Medical History:  Diagnosis Date   Anemia    Diabetes mellitus without complication (HCC)    Glaucoma    Hypertension    Osteomyelitis (HCC)    left 3rd toe   Wt Readings from Last 3 Encounters:  10/25/23 189 lb (85.7 kg)  10/25/23 189 lb (85.7 kg)  06/30/23 199 lb (90.3 kg)   Ht Readings from Last 3 Encounters:  10/25/23 5' 8 (1.727 m)  10/25/23 5' 8 (1.727 m)  06/30/23 5' 8 (1.727 m)   Body mass index is 28.74 kg/m. @BMIFA @ Facility age limit for growth %iles is 20 years. Facility age limit for growth %iles is 20 years.  Medications:  Current Outpatient Medications on File Prior to Visit  Medication Sig Dispense Refill   Accu-Chek Softclix Lancets lancets Use as instructed to monitor glucose once daily 100 each 12   amLODipine (NORVASC) 10 MG tablet Take 10 mg by mouth daily.     aspirin  81 MG chewable tablet Chew 1 tablet (81 mg total) by mouth daily. 30 tablet 2   Brinzolamide-Brimonidine (SIMBRINZA) 1-0.2 % SUSP Apply to eye.     Dulaglutide  (TRULICITY ) 1.5 MG/0.5ML SOAJ Inject 1.5 mg into the skin once a week. 6 mL 1   glucose blood (ACCU-CHEK GUIDE TEST) test strip Use as instructed to monitor glucose once daily 100 each 12   losartan-hydrochlorothiazide (HYZAAR) 50-12.5 MG tablet Take 1 tablet by mouth daily.     metFORMIN (GLUCOPHAGE) 1000 MG tablet Take 1,000 mg by mouth 2 (two) times  daily with a meal.     Netarsudil-Latanoprost (ROCKLATAN) 0.02-0.005 % SOLN Apply to eye.     rosuvastatin (CRESTOR) 10 MG tablet Take 10 mg by mouth daily.     timolol (TIMOPTIC) 0.5 % ophthalmic solution 1 drop 2 (two) times daily.     No current facility-administered medications on file prior to visit.    Labs:  Lab Results  Component Value Date   HGBA1C 6.5 (A) 10/25/2023      Latest Ref Rng & Units 02/19/2023   12:00 AM 10/29/2022    5:01 PM 10/29/2022    4:42 PM  CMP  Glucose 70 - 99 mg/dL  70  76   BUN 4 - 21 26     29  31    Creatinine 0.5 - 1.1 1.3     1.50  1.45   Sodium 135 - 145 mmol/L  143  140   Potassium 3.5 - 5.1 mmol/L  3.1  3.1   Chloride 98 - 111 mmol/L  110  108   CO2 22 - 32 mmol/L   22   Calcium 8.9 - 10.3 mg/dL   9.0   Total Protein 6.5 - 8.1 g/dL   8.0   Total Bilirubin 0.3 - 1.2 mg/dL   0.6   Alkaline Phos 38 - 126 U/L   57   AST 15 - 41  U/L   30   ALT 0 - 44 U/L   27      This result is from an external source.   Lipid Panel     Component Value Date/Time   CHOL 96 10/30/2022 0431   TRIG 110 02/19/2023 0000   HDL 42 10/30/2022 0431   CHOLHDL 2.3 10/30/2022 0431   VLDL 11 10/30/2022 0431   LDLCALC 63 02/19/2023 0000     Notable Signs/Symptoms:   Lifestyle & Dietary Hx LIves with her husband. Works 3rd shift.  Estimated daily fluid intake: 16 oz water; plus soda and koolaid Supplements:  Sleep:  Stress / self-care:  Current average weekly physical activity: ADL  24-Hr Dietary Recall Working on eating 3 meals per day  Estimated Energy Needs Calories: 1500 Carbohydrate: 170g Protein: 112g Fat: 42g   NUTRITION DIAGNOSIS  NB-1.1 Food and nutrition-related knowledge deficit As related to DIabets Type 2.  As evidenced by A1C.   NUTRITION INTERVENTION  Nutrition education (E-1) on the following topics:  Nutrition and Diabetes education provided on My Plate, CHO counting, meal planning, portion sizes, timing of meals, avoiding  snacks between meals unless having a low blood sugar, target ranges for A1C and blood sugars, signs/symptoms and treatment of hyper/hypoglycemia, monitoring blood sugars, taking medications as prescribed, benefits of exercising 30 minutes per day and prevention of complications of DM. Lifestyle Medicine  - Whole Food, Plant Predominant Nutrition is highly recommended: Eat Plenty of vegetables, Mushrooms, fruits, Legumes, Whole Grains, Nuts, seeds in lieu of processed meats, processed snacks/pastries red meat, poultry, eggs.    -It is better to avoid simple carbohydrates including: Cakes, Sweet Desserts, Ice Cream, Soda (diet and regular), Sweet Tea, Candies, Chips, Cookies, Store Bought Juices, Alcohol in Excess of  1-2 drinks a day, Lemonade,  Artificial Sweeteners, Doughnuts, Coffee Creamers, Sugar-free Products, etc, etc.  This is not a complete list.....  Exercise: If you are able: 30 -60 minutes a day ,4 days a week, or 150 minutes a week.  The longer the better.  Combine stretch, strength, and aerobic activities.  If you were told in the past that you have high risk for cardiovascular diseases, you may seek evaluation by your heart doctor prior to initiating moderate to intense exercise programs.    Handouts Provided Include  Lifestyle Medicine handouts  Learning Style & Readiness for Change Teaching method utilized: Visual & Auditory  Demonstrated degree of understanding via: Teach Back  Barriers to learning/adherence to lifestyle change: none  Goals Established by Pt  [facility] [address] [place] [facility].  Keep up the great job!!   MONITORING & EVALUATION Dietary intake, weekly physical activity, and blood sugars  in 1 month.  Next Steps  Patient is to work on meal planning and meal prepping.SABRA

## 2023-11-19 LAB — BASIC METABOLIC PANEL WITH GFR
BUN: 29 — AB (ref 4–21)
Creatinine: 1.4 — AB (ref 0.5–1.1)

## 2023-11-19 LAB — LIPID PANEL
LDL Cholesterol: 59
Triglycerides: 70 (ref 40–160)

## 2023-11-19 LAB — MICROALBUMIN / CREATININE URINE RATIO: Microalb Creat Ratio: 5

## 2023-11-19 LAB — COMPREHENSIVE METABOLIC PANEL WITH GFR: eGFR: 42

## 2023-12-02 ENCOUNTER — Other Ambulatory Visit (HOSPITAL_COMMUNITY): Payer: Self-pay | Admitting: Family Medicine

## 2023-12-02 DIAGNOSIS — Z1231 Encounter for screening mammogram for malignant neoplasm of breast: Secondary | ICD-10-CM

## 2023-12-10 ENCOUNTER — Ambulatory Visit (HOSPITAL_COMMUNITY)
Admission: RE | Admit: 2023-12-10 | Discharge: 2023-12-10 | Disposition: A | Discharge status: Home / Self Care | Source: Ambulatory Visit | Attending: Family Medicine | Admitting: Family Medicine

## 2023-12-10 ENCOUNTER — Ambulatory Visit (HOSPITAL_COMMUNITY)

## 2023-12-10 DIAGNOSIS — Z1231 Encounter for screening mammogram for malignant neoplasm of breast: Secondary | ICD-10-CM | POA: Diagnosis present

## 2023-12-23 ENCOUNTER — Other Ambulatory Visit: Payer: Self-pay | Admitting: Nurse Practitioner

## 2024-02-28 ENCOUNTER — Ambulatory Visit (INDEPENDENT_AMBULATORY_CARE_PROVIDER_SITE_OTHER): Admitting: Nurse Practitioner

## 2024-02-28 ENCOUNTER — Encounter: Payer: Self-pay | Admitting: Nutrition

## 2024-02-28 ENCOUNTER — Encounter: Payer: Self-pay | Admitting: Nurse Practitioner

## 2024-02-28 ENCOUNTER — Encounter: Attending: Nurse Practitioner | Admitting: Nutrition

## 2024-02-28 VITALS — BP 102/74 | HR 58 | Ht 68.0 in | Wt 203.2 lb

## 2024-02-28 VITALS — Ht 68.0 in | Wt 203.0 lb

## 2024-02-28 DIAGNOSIS — E782 Mixed hyperlipidemia: Secondary | ICD-10-CM | POA: Diagnosis not present

## 2024-02-28 DIAGNOSIS — Z7984 Long term (current) use of oral hypoglycemic drugs: Secondary | ICD-10-CM | POA: Diagnosis not present

## 2024-02-28 DIAGNOSIS — E1165 Type 2 diabetes mellitus with hyperglycemia: Secondary | ICD-10-CM

## 2024-02-28 DIAGNOSIS — Z7985 Long-term (current) use of injectable non-insulin antidiabetic drugs: Secondary | ICD-10-CM

## 2024-02-28 DIAGNOSIS — I10 Essential (primary) hypertension: Secondary | ICD-10-CM | POA: Insufficient documentation

## 2024-02-28 DIAGNOSIS — N183 Chronic kidney disease, stage 3 unspecified: Secondary | ICD-10-CM | POA: Diagnosis present

## 2024-02-28 DIAGNOSIS — E1122 Type 2 diabetes mellitus with diabetic chronic kidney disease: Secondary | ICD-10-CM | POA: Diagnosis present

## 2024-02-28 LAB — POCT GLYCOSYLATED HEMOGLOBIN (HGB A1C): Hemoglobin A1C: 6.9 % — AB (ref 4.0–5.6)

## 2024-02-28 MED ORDER — METFORMIN HCL 500 MG PO TABS: 500.0000 mg | ORAL_TABLET | Freq: Every day | ORAL | 1 refills | Status: AC

## 2024-02-28 MED ORDER — TRULICITY 3 MG/0.5ML ~~LOC~~ SOAJ: 3.0000 mg | SUBCUTANEOUS | 1 refills | Status: AC

## 2024-02-28 NOTE — Patient Instructions (Signed)
 Get back to cooking foods at home-more fruits, vegetables and whole grains. Walk at least 15-30 minutes a day Cut out snacks and processed foods Drink only water

## 2024-02-28 NOTE — Progress Notes (Signed)
 Medical Nutrition Therapy  Appointment Start time:  60  Appointment End time:  1145 Primary concerns today: Dm Type 2, CKD  Referral diagnosis: E11.8, N18.3 Preferred learning style: NO Preference  Learning readiness: Ready   NUTRITION ASSESSMENT Follow up  DM Saw Whitney Today. A1C 6.9%, up slightly from 6.5%.  Keeping PCP Dr. Marvine. Wants  to get back on track with walking and exercise. Wants to work on cooking better foods at home.  On Trulicity  for her DM and Metformin  Gained 13 lbs back from last visit.   Clinical Medical Hx: Wt Readings from Last 3 Encounters:  02/28/24 203 lb (92.1 kg)  02/28/24 203 lb 3.2 oz (92.2 kg)  10/25/23 189 lb (85.7 kg)   Ht Readings from Last 3 Encounters:  02/28/24 5' 8 (1.727 m)  02/28/24 5' 8 (1.727 m)  10/25/23 5' 8 (1.727 m)   Body mass index is 30.87 kg/m. @BMIFA @ Facility age limit for growth %iles is 20 years. Facility age limit for growth %iles is 20 years.  Past Medical History:  Diagnosis Date   Anemia    Diabetes mellitus without complication (HCC)    Glaucoma    Hypertension    Osteomyelitis (HCC)    left 3rd toe   Wt Readings from Last 3 Encounters:  02/28/24 203 lb 3.2 oz (92.2 kg)  10/25/23 189 lb (85.7 kg)  10/25/23 189 lb (85.7 kg)   Ht Readings from Last 3 Encounters:  02/28/24 5' 8 (1.727 m)  10/25/23 5' 8 (1.727 m)  10/25/23 5' 8 (1.727 m)   There is no height or weight on file to calculate BMI. @BMIFA @ Facility age limit for growth %iles is 20 years. Facility age limit for growth %iles is 20 years.  Medications:  Current Outpatient Medications on File Prior to Visit  Medication Sig Dispense Refill   Accu-Chek Softclix Lancets lancets Use as instructed to monitor glucose once daily 100 each 12   amLODipine (NORVASC) 10 MG tablet Take 10 mg by mouth daily.     aspirin  81 MG chewable tablet Chew 1 tablet (81 mg total) by mouth daily. 30 tablet 2   Brinzolamide-Brimonidine (SIMBRINZA)  1-0.2 % SUSP Apply to eye.     Dulaglutide  (TRULICITY ) 3 MG/0.5ML SOAJ Inject 3 mg as directed once a week. 6 mL 1   glucose blood (ACCU-CHEK GUIDE TEST) test strip Use as instructed to monitor glucose once daily 100 each 12   losartan-hydrochlorothiazide (HYZAAR) 50-12.5 MG tablet Take 1 tablet by mouth daily.     metFORMIN (GLUCOPHAGE) 500 MG tablet Take 1 tablet (500 mg total) by mouth daily. 90 tablet 1   Netarsudil-Latanoprost (ROCKLATAN) 0.02-0.005 % SOLN Apply to eye.     RESTASIS 0.05 % ophthalmic emulsion Place 1 drop into both eyes 2 (two) times daily.     rosuvastatin (CRESTOR) 10 MG tablet Take 10 mg by mouth daily.     timolol (TIMOPTIC) 0.5 % ophthalmic solution 1 drop 2 (two) times daily.     No current facility-administered medications on file prior to visit.    Labs:  Lab Results  Component Value Date   HGBA1C 6.9 (A) 02/28/2024      Latest Ref Rng & Units 11/19/2023   12:00 AM 02/19/2023   12:00 AM 10/29/2022    5:01 PM  CMP  Glucose 70 - 99 mg/dL   70   BUN 4 - 21 29     26     29    Creatinine  0.5 - 1.1 1.4     1.3     1.50   Sodium 135 - 145 mmol/L   143   Potassium 3.5 - 5.1 mmol/L   3.1   Chloride 98 - 111 mmol/L   110      This result is from an external source.   Lipid Panel     Component Value Date/Time   CHOL 96 10/30/2022 0431   TRIG 70 11/19/2023 0000   HDL 42 10/30/2022 0431   CHOLHDL 2.3 10/30/2022 0431   VLDL 11 10/30/2022 0431   LDLCALC 59 11/19/2023 0000     Notable Signs/Symptoms:   Lifestyle & Dietary Hx LIves with her husband. Works 3rd shift.  Estimated daily fluid intake: 16 oz water; plus soda and koolaid Supplements:  Sleep:  Stress / self-care:  Current average weekly physical activity: ADL  24-Hr Dietary Recall Working on eating 3 meals per day  Estimated Energy Needs Calories: 1500 Carbohydrate: 170g Protein: 112g Fat: 42g   NUTRITION DIAGNOSIS  NB-1.1 Food and nutrition-related knowledge deficit As related to  DIabets Type 2.  As evidenced by A1C.   NUTRITION INTERVENTION  Nutrition education (E-1) on the following topics:  Nutrition and Diabetes education provided on My Plate, CHO counting, meal planning, portion sizes, timing of meals, avoiding snacks between meals unless having a low blood sugar, target ranges for A1C and blood sugars, signs/symptoms and treatment of hyper/hypoglycemia, monitoring blood sugars, taking medications as prescribed, benefits of exercising 30 minutes per day and prevention of complications of DM. Lifestyle Medicine  - Whole Food, Plant Predominant Nutrition is highly recommended: Eat Plenty of vegetables, Mushrooms, fruits, Legumes, Whole Grains, Nuts, seeds in lieu of processed meats, processed snacks/pastries red meat, poultry, eggs.    -It is better to avoid simple carbohydrates including: Cakes, Sweet Desserts, Ice Cream, Soda (diet and regular), Sweet Tea, Candies, Chips, Cookies, Store Bought Juices, Alcohol in Excess of  1-2 drinks a day, Lemonade,  Artificial Sweeteners, Doughnuts, Coffee Creamers, Sugar-free Products, etc, etc.  This is not a complete list.....  Exercise: If you are able: 30 -60 minutes a day ,4 days a week, or 150 minutes a week.  The longer the better.  Combine stretch, strength, and aerobic activities.  If you were told in the past that you have high risk for cardiovascular diseases, you may seek evaluation by your heart doctor prior to initiating moderate to intense exercise programs.    Handouts Provided Include  Lifestyle Medicine handouts  Learning Style & Readiness for Change Teaching method utilized: Visual & Auditory  Demonstrated degree of understanding via: Teach Back  Barriers to learning/adherence to lifestyle change: none  Goals Established by Pt  Get back to cooking foods at home-more fruits, vegetables and whole grains. Walk at least 15-30 minutes a day Cut out snacks and processed foods Drink only  water    MONITORING & EVALUATION Dietary intake, weekly physical activity, and blood sugars  in # month.  Next Steps  Patient is to work on meal planning and meal prepping.SABRA

## 2024-02-28 NOTE — Progress Notes (Signed)
 Endocrinology Follow Up Note       02/28/2024, 11:07 AM   Subjective:    Patient ID: Jenny Newton, female    DOB: 1965/09/08.  Jenny Newton is being seen in follow up after being seen in consultation for management of currently uncontrolled symptomatic diabetes requested by  Marvine Rush, MD.   Past Medical History:  Diagnosis Date   Anemia    Diabetes mellitus without complication (HCC)    Glaucoma    Hypertension    Osteomyelitis (HCC)    left 3rd toe    Past Surgical History:  Procedure Laterality Date   AMPUTATION TOE Left 06/20/2020   Procedure: Left 3rd toe amputation;  Surgeon: Kit Rush, MD;  Location: [facility];  Service: Orthopedics;  Laterality: Left;    AMPUTATION TOE Right 01/02/2021   Procedure: Right 3rd and 4th toe amputations at the middle phalanx;  Surgeon: Kit Rush, MD;  Location: [facility];  Service: Orthopedics;  Laterality: Right;   CATARACT EXTRACTION, BILATERAL     eue surgery  2018   EYE SURGERY  2019   left breast surgery  2009    Social History   Socioeconomic History   Marital status: Married    Spouse name: jerry   Number of children: 0   Years of education: Not on file   Highest education level: Bachelor's degree (e.g., BA, AB, BS)  Occupational History   Not on file  Tobacco Use   Smoking status: Never   Smokeless tobacco: Never  Vaping Use   Vaping status: Never Used  Substance and Sexual Activity   Alcohol use: Not Currently   Drug use: Not Currently   Sexual activity: Yes    Birth control/protection: None, Post-menopausal  Other Topics Concern   Not on file  Social History Narrative   Not on file   Social Drivers of Health   Financial Resource Strain: Low Risk  (12/18/2019)   Overall Financial Resource Strain (CARDIA)    Difficulty of Paying Living Expenses: Not hard at all  Food Insecurity: No Food Insecurity  (10/29/2022)   Hunger Vital Sign    Worried About Running Out of Food in the Last Year: Never true    Ran Out of Food in the Last Year: Never true  Transportation Needs: No Transportation Needs (10/29/2022)   PRAPARE - Administrator, Civil Service (Medical): No    Lack of Transportation (Non-Medical): No  Physical Activity: Inactive (12/18/2019)   Exercise Vital Sign    Days of Exercise per Week: 0 days    Minutes of Exercise per Session: 0 min  Stress: No Stress Concern Present (12/18/2019)   [employer] of Occupational Health - Occupational Stress Questionnaire    Feeling of Stress : Not at all  Social Connections: Moderately Integrated (12/18/2019)   Social Connection and Isolation Panel    Frequency of Communication with Friends and Family: Twice a week    Frequency of Social Gatherings with Friends and Family: Once a week    Attends Religious Services: 1 to 4 times per year    Active Member of Clubs or Organizations: No  Attends Banker Meetings: Never    Marital Status: Married    Family History  Problem Relation Age of Onset   Hypertension Father    Diabetes Father     Outpatient Encounter Medications as of 02/28/2024  Medication Sig   Accu-Chek Softclix Lancets lancets Use as instructed to monitor glucose once daily   amLODipine (NORVASC) 10 MG tablet Take 10 mg by mouth daily.   aspirin  81 MG chewable tablet Chew 1 tablet (81 mg total) by mouth daily.   Brinzolamide-Brimonidine (SIMBRINZA) 1-0.2 % SUSP Apply to eye.   Dulaglutide  (TRULICITY ) 3 MG/0.5ML SOAJ Inject 3 mg as directed once a week.   glucose blood (ACCU-CHEK GUIDE TEST) test strip Use as instructed to monitor glucose once daily   losartan-hydrochlorothiazide (HYZAAR) 50-12.5 MG tablet Take 1 tablet by mouth daily.   Netarsudil-Latanoprost (ROCKLATAN) 0.02-0.005 % SOLN Apply to eye.   RESTASIS 0.05 % ophthalmic emulsion Place 1 drop into both eyes 2 (two) times daily.    rosuvastatin (CRESTOR) 10 MG tablet Take 10 mg by mouth daily.   timolol (TIMOPTIC) 0.5 % ophthalmic solution 1 drop 2 (two) times daily.   [DISCONTINUED] Dulaglutide  (TRULICITY ) 1.5 MG/0.5ML SOAJ INJECT 1.5 MG SUBCUTANEOUSLY ONCE A WEEK   [DISCONTINUED] metFORMIN (GLUCOPHAGE) 1000 MG tablet Take 1,000 mg by mouth 2 (two) times daily with a meal. (Patient taking differently: Take 500 mg by mouth 2 (two) times daily with a meal.)   metFORMIN (GLUCOPHAGE) 500 MG tablet Take 1 tablet (500 mg total) by mouth daily.   No facility-administered encounter medications on file as of 02/28/2024.    ALLERGIES: No Known Allergies  VACCINATION STATUS: Immunization History  Administered Date(s) Administered   Janssen (J&J) SARS-COV-2 Vaccination 09/30/2019    Diabetes She presents for her follow-up diabetic visit. She has type 2 diabetes mellitus. Onset time: diagnosed at approx age of 53. Her disease course has been stable. There are no hypoglycemic associated symptoms. Pertinent negatives for diabetes include no fatigue, no polydipsia and no polyuria. There are no hypoglycemic complications. Symptoms are improving. 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 (Multiple toe amputations from osteomyelitis) Risk factors for coronary artery disease include diabetes mellitus, family history, hypertension and sedentary lifestyle. Current diabetic treatment includes oral agent (monotherapy) (and Trulicity ). She is compliant with treatment most of the time. Her weight is fluctuating minimally. She is following a generally healthy diet. When asked about meal planning, she reported none. She has not had a previous visit with a dietitian. She participates in exercise intermittently. Her home blood glucose trend is fluctuating minimally. Her breakfast blood glucose range is generally 110-130 mg/dl. (She presents today with her logs showing at goal glycemic profile.  Her POCT A1c  today is 6.9%, increasing slightly from last visit of 6.5%.  She is tolerating the Trulicity  well, no unpleasant side effects.  She denies any hypoglycemia.  She notes she has cheated on her diet some.) An ACE inhibitor/angiotensin II receptor blocker is being taken. She sees a podiatrist.Eye exam is current.    Review of systems  Constitutional: + increasing body weight,  current Body mass index is 30.9 kg/m. , no fatigue, no subjective hyperthermia, no subjective hypothermia Eyes: no blurry vision, no xerophthalmia ENT: no sore throat, no nodules palpated in throat, no dysphagia/odynophagia, no hoarseness Cardiovascular: no chest pain, no shortness of breath, no palpitations, no leg swelling Respiratory: no cough, no shortness of breath Gastrointestinal: no nausea/vomiting/diarrhea Musculoskeletal: no muscle/joint aches  Skin: no rashes, no hyperemia Neurological: no tremors, no numbness, no tingling, no dizziness Psychiatric: no depression, no anxiety  Objective:     BP 102/74 (BP Location: Left Arm, Patient Position: Sitting, Cuff Size: Large)   Pulse (!) 58   Ht 5' 8 (1.727 m)   Wt 203 lb 3.2 oz (92.2 kg)   BMI 30.90 kg/m   Wt Readings from Last 3 Encounters:  02/28/24 203 lb 3.2 oz (92.2 kg)  10/25/23 189 lb (85.7 kg)  10/25/23 189 lb (85.7 kg)     BP Readings from Last 3 Encounters:  02/28/24 102/74  10/25/23 112/62  06/25/23 128/68      Physical Exam- Limited  Constitutional:  Body mass index is 30.9 kg/m. , not in acute distress, normal state of mind Eyes:  EOMI, no exophthalmos Musculoskeletal: no gross deformities, strength intact in all four extremities, no gross restriction of joint movements Skin:  no rashes, no hyperemia Neurological: no tremor with outstretched hands   Diabetic Foot Exam - Simple   No data filed      CMP ( most recent) CMP     Component Value Date/Time   NA 143 10/29/2022 1701   K 3.1 (L) 10/29/2022 1701   CL 110 10/29/2022  1701   CO2 22 10/29/2022 1642   GLUCOSE 70 10/29/2022 1701   BUN 29 (A) 11/19/2023 0000   CREATININE 1.4 (A) 11/19/2023 0000   CREATININE 1.50 (H) 10/29/2022 1701   CALCIUM 9.0 10/29/2022 1642   PROT 8.0 10/29/2022 1642   ALBUMIN 3.6 10/29/2022 1642   AST 30 10/29/2022 1642   ALT 27 10/29/2022 1642   ALKPHOS 57 10/29/2022 1642   BILITOT 0.6 10/29/2022 1642   EGFR 42 11/19/2023 0000   GFRNONAA 42 (L) 10/29/2022 1642     Diabetic Labs (most recent): Lab Results  Component Value Date   HGBA1C 6.9 (A) 02/28/2024   HGBA1C 6.5 (A) 10/25/2023   HGBA1C 7.3 (A) 06/25/2023     Lipid Panel ( most recent) Lipid Panel     Component Value Date/Time   CHOL 96 10/30/2022 0431   TRIG 70 11/19/2023 0000   HDL 42 10/30/2022 0431   CHOLHDL 2.3 10/30/2022 0431   VLDL 11 10/30/2022 0431   LDLCALC 59 11/19/2023 0000               Assessment & Plan:   1) Type 2 diabetes mellitus with hyperglycemia, without long-term current use of insulin  (HCC)  She presents today with her logs showing at goal glycemic profile.  Her POCT A1c today is 6.9%, increasing slightly from last visit of 6.5%.  She is tolerating the Trulicity  well, no unpleasant side effects.  She denies any hypoglycemia.  She notes she has cheated on her diet some.  - Jenny Newton has currently uncontrolled symptomatic type 2 DM since 58 years of age.   -Recent labs reviewed.  - I had a long discussion with her about the progressive nature of diabetes and the pathology behind its complications. -her diabetes is complicated by TIA, CKD, retinopathy and she remains at a high risk for more acute and chronic complications which include CAD, CVA, CKD, retinopathy, and neuropathy. These are all discussed in detail with her.  The following Lifestyle Medicine recommendations according to American College of Lifestyle Medicine [facility]) were discussed and offered to patient and she agrees to start the journey:  A. Whole Foods,  Plant-based plate comprising of fruits and vegetables, plant-based proteins, whole-grain carbohydrates  was discussed in detail with the patient.   A list for source of those nutrients were also provided to the patient.  Patient will use only water or unsweetened tea for hydration. B.  The need to stay away from risky substances including alcohol, smoking; obtaining 7 to 9 hours of restorative sleep, at least 150 minutes of moderate intensity exercise weekly, the importance of healthy social connections,  and stress reduction techniques were discussed. C.  A full color page of  Calorie density of various food groups per pound showing examples of each food groups was provided to the patient.  - Nutritional counseling repeated/built upon at each appointment.  - The patient admits there is a room for improvement in their diet and drink choices. -  Suggestion is made for the patient to avoid simple carbohydrates from their diet including Cakes, Sweet Desserts / Pastries, Ice Cream, Soda (diet and regular), Sweet Tea, Candies, Chips, Cookies, Sweet Pastries, Store Bought Juices, Alcohol in Excess of 1-2 drinks a day, Artificial Sweeteners, Coffee Creamer, and Sugar-free Products. This will help patient to have stable blood glucose profile and potentially avoid unintended weight gain.   - I encouraged the patient to switch to unprocessed or minimally processed complex starch and increased protein intake (animal or plant source), fruits, and vegetables.   - Patient is advised to stick to a routine mealtimes to eat 3 meals a day and avoid unnecessary snacks (to snack only to correct hypoglycemia).  - I have approached her with the following individualized plan to manage her diabetes and patient agrees:   -Will try to work her off Metformin to take pressure off her kidneys, will lower Metformin to 500 mg once daily.  Will increase Trulicity  to 3 mg SQ weekly.   -she is encouraged to start monitoring glucose  1 times daily (1-2 times per week), before breakfast, and to call the clinic if she has readings less than 70 or above 200 for 3 tests in a row.    - Adjustment parameters are given to her for hypo and hyperglycemia in writing.  - she is not a candidate for full dose Metformin due to concurrent renal insufficiency.  - Specific targets for  A1c; LDL, HDL, and Triglycerides were discussed with the patient.  2) Blood Pressure /Hypertension:  her blood pressure is controlled to target.   she is advised to continue her current medications as prescribed by her PCP.  3) Lipids/Hyperlipidemia:    Review of her recent lipid panel from 11/19/23 showed controlled LDL at 59.  she is advised to continue Crestor 10 mg daily at bedtime.  Side effects and precautions discussed with her.  4)  Weight/Diet:  her Body mass index is 30.9 kg/m.  -  clearly complicating her diabetes care.   she is a candidate for weight loss. I discussed with her the fact that loss of 5 - 10% of her  current body weight will have the most impact on her diabetes management.  Exercise, and detailed carbohydrates information provided  -  detailed on discharge instructions.  5) Chronic Care/Health Maintenance: -she is on ACEI/ARB and Statin medications and is encouraged to initiate and continue to follow up with Ophthalmology, Dentist, Podiatrist at least yearly or according to recommendations, and advised to stay away from smoking. I have recommended yearly flu vaccine and pneumonia vaccine at least every 5 years; moderate intensity exercise for up to 150 minutes weekly; and sleep for at least 7 hours a day.  -  she is advised to maintain close follow up with Marvine Rush, MD for primary care needs, as well as her other providers for optimal and coordinated care.     I spent  22  minutes in the care of the patient today including review of labs from CMP, Lipids, Thyroid Function, Hematology (current and previous including  abstractions from other facilities); face-to-face time discussing  her blood glucose readings/logs, discussing hypoglycemia and hyperglycemia episodes and symptoms, medications doses, her options of short and long term treatment based on the latest standards of care / guidelines;  discussion about incorporating lifestyle medicine;  and documenting the encounter. Risk reduction counseling performed per USPSTF guidelines to reduce obesity and cardiovascular risk factors.     Please refer to Patient Instructions for Blood Glucose Monitoring and Insulin /Medications Dosing Guide  in media tab for additional information. Please  also refer to  Patient Self Inventory in the Media  tab for reviewed elements of pertinent patient history.  Jenny Newton participated in the discussions, expressed understanding, and voiced agreement with the above plans.  All questions were answered to her satisfaction. she is encouraged to contact clinic should she have any questions or concerns prior to her return visit.     Follow up plan: - Return in about 4 months (around 06/27/2024) for Diabetes F/U with A1c in office, Previsit labs, Bring meter and logs.   Benton Rio, [facility] [address] [place], [place] [address] Phone: [phone] Fax: [phone]  02/28/2024, 11:07 AM

## 2024-03-18 ENCOUNTER — Other Ambulatory Visit: Payer: Self-pay | Admitting: Nurse Practitioner

## 2024-03-21 ENCOUNTER — Other Ambulatory Visit: Payer: Self-pay | Admitting: Nurse Practitioner

## 2024-03-26 ENCOUNTER — Other Ambulatory Visit: Payer: Self-pay | Admitting: Nurse Practitioner

## 2024-06-27 ENCOUNTER — Ambulatory Visit: Admitting: Nurse Practitioner

## 2024-06-27 ENCOUNTER — Encounter: Admitting: Nutrition
